# Patient Record
Sex: Female | Born: 1955 | Race: White | Hispanic: No | Marital: Married | Smoking: Current every day smoker
Health system: Southern US, Community
[De-identification: ages and names within clinical notes are randomized; demographics above are authoritative.]

## PROBLEM LIST (undated history)

## (undated) DIAGNOSIS — M1990 Unspecified osteoarthritis, unspecified site: Secondary | ICD-10-CM

## (undated) HISTORY — PX: PELVIC FRACTURE SURGERY: SHX119

## (undated) HISTORY — PX: HIP SURGERY: SHX245

## (undated) HISTORY — PX: FOOT SURGERY: SHX648

---

## 1998-05-31 ENCOUNTER — Other Ambulatory Visit: Admission: RE | Admit: 1998-05-31 | Discharge: 1998-05-31 | Payer: Self-pay | Admitting: Gynecology

## 1999-06-28 ENCOUNTER — Other Ambulatory Visit: Admission: RE | Admit: 1999-06-28 | Discharge: 1999-06-28 | Payer: Self-pay | Admitting: Gynecology

## 1999-09-16 ENCOUNTER — Encounter: Admission: RE | Admit: 1999-09-16 | Discharge: 1999-09-16 | Payer: Self-pay | Admitting: Gynecology

## 1999-09-16 ENCOUNTER — Encounter: Payer: Self-pay | Admitting: Gynecology

## 2000-07-25 ENCOUNTER — Other Ambulatory Visit: Admission: RE | Admit: 2000-07-25 | Discharge: 2000-07-25 | Payer: Self-pay | Admitting: Gynecology

## 2000-09-25 ENCOUNTER — Encounter: Admission: RE | Admit: 2000-09-25 | Discharge: 2000-09-25 | Payer: Self-pay | Admitting: Gynecology

## 2000-09-25 ENCOUNTER — Encounter: Payer: Self-pay | Admitting: Gynecology

## 2001-08-05 ENCOUNTER — Other Ambulatory Visit: Admission: RE | Admit: 2001-08-05 | Discharge: 2001-08-05 | Payer: Self-pay | Admitting: Gynecology

## 2001-10-10 ENCOUNTER — Encounter: Payer: Self-pay | Admitting: Gynecology

## 2001-10-10 ENCOUNTER — Encounter: Admission: RE | Admit: 2001-10-10 | Discharge: 2001-10-10 | Payer: Self-pay | Admitting: Gynecology

## 2002-10-15 ENCOUNTER — Encounter: Admission: RE | Admit: 2002-10-15 | Discharge: 2002-10-15 | Payer: Self-pay | Admitting: Gynecology

## 2002-10-15 ENCOUNTER — Encounter: Payer: Self-pay | Admitting: Gynecology

## 2002-10-27 ENCOUNTER — Other Ambulatory Visit: Admission: RE | Admit: 2002-10-27 | Discharge: 2002-10-27 | Payer: Self-pay | Admitting: Gynecology

## 2003-12-07 ENCOUNTER — Other Ambulatory Visit: Admission: RE | Admit: 2003-12-07 | Discharge: 2003-12-07 | Payer: Self-pay | Admitting: Gynecology

## 2005-01-27 ENCOUNTER — Other Ambulatory Visit: Admission: RE | Admit: 2005-01-27 | Discharge: 2005-01-27 | Payer: Self-pay | Admitting: Gynecology

## 2005-05-05 ENCOUNTER — Encounter: Admission: RE | Admit: 2005-05-05 | Discharge: 2005-05-05 | Payer: Self-pay | Admitting: Family Medicine

## 2005-07-05 ENCOUNTER — Encounter: Admission: RE | Admit: 2005-07-05 | Discharge: 2005-07-05 | Payer: Self-pay | Admitting: Orthopedic Surgery

## 2005-09-13 ENCOUNTER — Encounter: Admission: RE | Admit: 2005-09-13 | Discharge: 2005-09-13 | Payer: Self-pay | Admitting: Orthopedic Surgery

## 2006-02-01 ENCOUNTER — Other Ambulatory Visit: Admission: RE | Admit: 2006-02-01 | Discharge: 2006-02-01 | Payer: Self-pay | Admitting: Gynecology

## 2007-01-29 ENCOUNTER — Other Ambulatory Visit: Admission: RE | Admit: 2007-01-29 | Discharge: 2007-01-29 | Payer: Self-pay | Admitting: Gynecology

## 2008-04-21 ENCOUNTER — Encounter: Admission: RE | Admit: 2008-04-21 | Discharge: 2008-04-21 | Payer: Self-pay | Admitting: Family Medicine

## 2010-12-10 ENCOUNTER — Encounter: Payer: Self-pay | Admitting: Orthopedic Surgery

## 2011-01-20 ENCOUNTER — Ambulatory Visit
Admission: RE | Admit: 2011-01-20 | Discharge: 2011-01-20 | Disposition: A | Discharge status: Home / Self Care | Payer: BC Managed Care – PPO | Source: Ambulatory Visit | Attending: Family Medicine | Admitting: Family Medicine

## 2011-01-20 ENCOUNTER — Other Ambulatory Visit: Payer: Self-pay | Admitting: Family Medicine

## 2011-01-20 DIAGNOSIS — R059 Cough, unspecified: Secondary | ICD-10-CM

## 2011-01-20 DIAGNOSIS — R05 Cough: Secondary | ICD-10-CM

## 2012-08-29 ENCOUNTER — Emergency Department (INDEPENDENT_AMBULATORY_CARE_PROVIDER_SITE_OTHER)
Admission: EM | Admit: 2012-08-29 | Discharge: 2012-08-29 | Disposition: A | Discharge status: Home / Self Care | Payer: BC Managed Care – PPO | Source: Home / Self Care | Attending: Family Medicine | Admitting: Family Medicine

## 2012-08-29 ENCOUNTER — Encounter (HOSPITAL_COMMUNITY): Payer: Self-pay | Admitting: *Deleted

## 2012-08-29 ENCOUNTER — Emergency Department (HOSPITAL_COMMUNITY): Payer: BC Managed Care – PPO | Attending: Emergency Medicine

## 2012-08-29 DIAGNOSIS — M79672 Pain in left foot: Secondary | ICD-10-CM

## 2012-08-29 DIAGNOSIS — M79609 Pain in unspecified limb: Secondary | ICD-10-CM

## 2012-08-29 DIAGNOSIS — S92309A Fracture of unspecified metatarsal bone(s), unspecified foot, initial encounter for closed fracture: Secondary | ICD-10-CM

## 2012-08-29 DIAGNOSIS — X58XXXA Exposure to other specified factors, initial encounter: Secondary | ICD-10-CM | POA: Insufficient documentation

## 2012-08-29 MED ORDER — IBUPROFEN 600 MG PO TABS: 600.0000 mg | ORAL_TABLET | Freq: Four times a day (QID) | ORAL | Status: DC | PRN

## 2012-08-29 MED ORDER — HYDROCODONE-ACETAMINOPHEN 5-500 MG PO TABS: 1.0000 | ORAL_TABLET | Freq: Four times a day (QID) | ORAL | Status: DC | PRN

## 2012-08-29 NOTE — ED Notes (Signed)
Reported  Off  To    kim

## 2012-08-29 NOTE — ED Provider Notes (Signed)
History     CSN: [id number]  Arrival date & time 08/29/12  1612   First MD Initiated Contact with Patient 08/29/12 1730      Chief Complaint  Patient presents with  . Foot Injury    (Consider location/radiation/quality/duration/timing/severity/associated sxs/prior treatment) HPI  History reviewed. No pertinent past medical history.  Past Surgical History  Procedure Date  . Hip surgery   . Foot surgery     No family history on file.  History  Substance Use Topics  . Smoking status: Current Every Day Smoker  . Smokeless tobacco: Not on file  . Alcohol Use: Yes    OB History    Grav Para Term Preterm Abortions TAB SAB Ect Mult Living                  Review of Systems  Allergies  Review of patient's allergies indicates not on file.  Home Medications   Current Outpatient Rx  Name Route Sig Dispense Refill  . CITRACAL PO Oral Take by mouth.    Truitt Merle DS PO Oral Take by mouth.    . MULTIVITAMINS PO CAPS Oral Take 1 capsule by mouth daily.    Marland Kitchen ZOLOFT PO Oral Take by mouth.    Marland Kitchen HYDROCODONE-ACETAMINOPHEN 5-500 MG PO TABS Oral Take 1 tablet by mouth every 6 (six) hours as needed for pain. 20 tablet 0  . IBUPROFEN 600 MG PO TABS Oral Take 1 tablet (600 mg total) by mouth every 6 (six) hours as needed for pain. 30 tablet 0    BP 106/79  Pulse 74  Temp 98.4 F (36.9 C) (Oral)  Resp 16  SpO2 99%  Physical Exam  ED Course  Procedures (including critical care time)  Labs Reviewed - No data to display Dg Foot Complete Left  08/29/2012  *RADIOLOGY REPORT*  Clinical Data: Foot pain status post injury today.  LEFT FOOT - COMPLETE 3+ VIEW  Comparison: Left ankle radiographs 05/02/2010 and MRI 05/12/2010.  Findings: There are mildly displaced acute fractures involving the bases of the second, third and fourth metatarsals.  There is a possible nondisplaced fracture involving the first metatarsal base. Some of these fractures may extend into the Lisfranc joint.   There is no significant subluxation at the Lisfranc joint.  The bones of the midfoot appear intact.  Calcaneal stress fracture has healed.  IMPRESSION: Mildly displaced fractures of the second through fourth metatarsal bases with possible proximal intra-articular extension.  Possible nondisplaced first metatarsal base fracture.  No dislocation at the Lisfranc joint identified.   Original Report Authenticated By: Gerrianne Scale, M.D.      1. Closed fracture of base of metatarsal bone   2. Left foot pain       MDM          Linna Hoff, MD 08/29/12 2023

## 2012-08-29 NOTE — ED Notes (Signed)
Pt  Rep[orts   approx 2  Hours  Ago  She  Felled  On her    l  Foot  She  Has  Pain   Over  The  Top  Of  The  Foot  She  Has  Pain on  Weight   Bearing

## 2012-08-29 NOTE — ED Notes (Signed)
Returned from radiology -went to the main hospital radiology

## 2012-08-29 NOTE — Progress Notes (Signed)
Orthopedic Tech Progress Note Patient Details:  Julie Schneider [birth year] [id number]  Ortho Devices Type of Ortho Device: Post (short) splint Splint Material: Fiberglass Ortho Device/Splint Location: (L) LE Ortho Device/Splint Interventions: Ordered;Application   Jennye Moccasin 08/29/2012, 7:19 PM

## 2012-08-29 NOTE — ED Notes (Signed)
Report to radiology given

## 2012-08-29 NOTE — ED Notes (Addendum)
Ortho tech applied posterior splint.  Pt. wheeled to car in W/C and assisted into car. Pt. insists on driving home.

## 2012-08-29 NOTE — ED Provider Notes (Signed)
History     CSN: [id number]  Arrival date & time 08/29/12  1612   None     Chief Complaint  Patient presents with  . Foot Injury    (Consider location/radiation/quality/duration/timing/severity/associated sxs/prior treatment) The history is provided by the patient.  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  States pain is sharp and constant in nature felt mostly over the top of her foot associated with swelling.  Pain is worse with ambulation and movement.  Has not taken medication for pain.  Reports past history of left foot fracture 1-2 years ago.  History reviewed. No pertinent past medical history.  Past Surgical History  Procedure Date  . Hip surgery   . Foot surgery     No family history on file.  History  Substance Use Topics  . Smoking status: Current Every Day Smoker  . Smokeless tobacco: Not on file  . Alcohol Use: Yes    OB History    Grav Para Term Preterm Abortions TAB SAB Ect Mult Living                  Review of Systems  Constitutional: Negative.   Respiratory: Negative.   Cardiovascular: Negative.   Musculoskeletal: Positive for joint swelling, arthralgias and gait problem.  Skin: Negative.   Neurological: Negative.     Allergies  Review of patient's allergies indicates not on file.  Home Medications   Current Outpatient Rx  Name Route Sig Dispense Refill  . CITRACAL PO Oral Take by mouth.    Truitt Merle DS PO Oral Take by mouth.    . MULTIVITAMINS PO CAPS Oral Take 1 capsule by mouth daily.    Marland Kitchen ZOLOFT PO Oral Take by mouth.    Marland Kitchen HYDROCODONE-ACETAMINOPHEN 5-500 MG PO TABS Oral Take 1 tablet by mouth every 6 (six) hours as needed for pain. 20 tablet 0  . IBUPROFEN 600 MG PO TABS Oral Take 1 tablet (600 mg total) by mouth every 6 (six) hours as needed for pain. 30 tablet 0    BP 106/79  Pulse 74  Temp 98.4 F (36.9 C) (Oral)  Resp 16  SpO2 99%  Physical  Exam  Nursing note and vitals reviewed. Constitutional: She is oriented to person, place, and time. Vital signs are normal. She appears well-developed and well-nourished. She is active and cooperative.  HENT:  Head: Normocephalic.  Eyes: Conjunctivae normal are normal. Pupils are equal, round, and reactive to light. No scleral icterus.  Neck: Trachea normal. Neck supple.  Cardiovascular: Normal rate, regular rhythm and normal heart sounds.   Pulmonary/Chest: Effort normal and breath sounds normal. No respiratory distress.  Musculoskeletal:       Left knee: Normal.       Right ankle: Normal.       Left ankle: Normal. Achilles tendon normal.       Right foot: Normal.       Left foot: She exhibits tenderness, bony tenderness and swelling. She exhibits normal range of motion, normal capillary refill, no crepitus, no deformity and no laceration.       Feet:  Neurological: She is alert and oriented to person, place, and time. She has normal strength. No cranial nerve deficit or sensory deficit. Gait abnormal. Coordination normal. GCS eye subscore is 4. GCS verbal subscore is 5. GCS motor subscore is 6.       Sensation intact distally, left foot  weakness secondary to pain.  Skin: Skin is warm, dry and intact.  Psychiatric: She has a normal mood and affect. Her speech is normal and behavior is normal. Judgment and thought content normal. Cognition and memory are normal.    ED Course  Procedures (including critical care time)  Labs Reviewed - No data to display No results found. *RADIOLOGY REPORT*  Clinical Data: Foot pain status post injury today.  LEFT FOOT - COMPLETE 3+ VIEW  Comparison: Left ankle radiographs 05/02/2010 and MRI 05/12/2010.  Findings: There are mildly displaced acute fractures involving the  bases of the second, third and fourth metatarsals. There is a  possible nondisplaced fracture involving the first metatarsal base.  Some of these fractures may extend into the  Lisfranc joint. There  is no significant subluxation at the Lisfranc joint. The bones of  the midfoot appear intact. Calcaneal stress fracture has healed.  IMPRESSION:  Mildly displaced fractures of the second through fourth metatarsal  bases with possible proximal intra-articular extension. Possible  nondisplaced first metatarsal base fracture. No dislocation at the  Lisfranc joint identified.  Original Report Authenticated By: Gerrianne Scale, M.D.   1. Closed fracture of base of metatarsal bone   2. Left foot pain       MDM  Patient care delay related to xray malfunction-sent to [facility] for imaging.  Patient refused analgesia related to no driver present, requested ice only. Phone Consult Dr. Venita Lick, orthopedist.  Posterior splint, follow up in office tomorrow.        Johnsie Kindred, NP 09/05/12 [phone]

## 2012-08-29 NOTE — Progress Notes (Signed)
Orthopedic Tech Progress Note Patient Details:  Julie Schneider 1956-03-08 [id number]  Ortho Devices Type of Ortho Device: Post (short) splint;Ace wrap Splint Material: Fiberglass Ortho Device/Splint Location: (L) LE Ortho Device/Splint Interventions: Ordered;Application   Jennye Moccasin 08/29/2012, 7:19 PM

## 2012-08-29 NOTE — ED Notes (Signed)
Ice pack applied after xrays.

## 2012-08-29 NOTE — ED Notes (Signed)
Ortho paged. 

## 2012-09-06 NOTE — ED Provider Notes (Signed)
Medical screening examination/treatment/procedure(s) were performed by resident physician or non-physician practitioner and as supervising physician I was immediately available for consultation/collaboration.   Lucielle Vokes DOUGLAS MD.    Denina Rieger D Zurisadai Helminiak, MD 09/06/12 1406 

## 2012-10-09 ENCOUNTER — Other Ambulatory Visit: Payer: Self-pay | Admitting: Family Medicine

## 2012-10-09 DIAGNOSIS — E049 Nontoxic goiter, unspecified: Secondary | ICD-10-CM

## 2012-10-11 ENCOUNTER — Other Ambulatory Visit: Payer: Self-pay

## 2012-10-11 ENCOUNTER — Ambulatory Visit
Admission: RE | Admit: 2012-10-11 | Discharge: 2012-10-11 | Disposition: A | Discharge status: Home / Self Care | Payer: BC Managed Care – PPO | Source: Ambulatory Visit | Attending: Family Medicine | Admitting: Family Medicine

## 2012-10-11 DIAGNOSIS — E049 Nontoxic goiter, unspecified: Secondary | ICD-10-CM

## 2013-09-23 DIAGNOSIS — S32409A Unspecified fracture of unspecified acetabulum, initial encounter for closed fracture: Secondary | ICD-10-CM | POA: Insufficient documentation

## 2013-10-28 DIAGNOSIS — M25551 Pain in right hip: Secondary | ICD-10-CM | POA: Insufficient documentation

## 2013-11-05 ENCOUNTER — Other Ambulatory Visit: Payer: Self-pay | Admitting: Family Medicine

## 2013-11-05 DIAGNOSIS — E041 Nontoxic single thyroid nodule: Secondary | ICD-10-CM

## 2013-11-06 ENCOUNTER — Ambulatory Visit
Admission: RE | Admit: 2013-11-06 | Discharge: 2013-11-06 | Disposition: A | Discharge status: Home / Self Care | Payer: 59 | Source: Ambulatory Visit | Attending: Family Medicine | Admitting: Family Medicine

## 2013-11-06 DIAGNOSIS — E041 Nontoxic single thyroid nodule: Secondary | ICD-10-CM

## 2014-01-13 ENCOUNTER — Other Ambulatory Visit (HOSPITAL_COMMUNITY): Payer: Self-pay | Admitting: Orthopaedic Surgery

## 2014-01-13 DIAGNOSIS — S32409A Unspecified fracture of unspecified acetabulum, initial encounter for closed fracture: Secondary | ICD-10-CM

## 2014-01-22 ENCOUNTER — Ambulatory Visit (HOSPITAL_COMMUNITY)
Admission: RE | Admit: 2014-01-22 | Discharge: 2014-01-22 | Disposition: A | Discharge status: Home / Self Care | Payer: 59 | Source: Ambulatory Visit | Attending: Orthopaedic Surgery | Admitting: Orthopaedic Surgery

## 2014-01-22 DIAGNOSIS — X58XXXA Exposure to other specified factors, initial encounter: Secondary | ICD-10-CM | POA: Insufficient documentation

## 2014-01-22 DIAGNOSIS — S32509A Unspecified fracture of unspecified pubis, initial encounter for closed fracture: Secondary | ICD-10-CM | POA: Insufficient documentation

## 2014-01-22 DIAGNOSIS — Z96649 Presence of unspecified artificial hip joint: Secondary | ICD-10-CM | POA: Insufficient documentation

## 2014-01-22 DIAGNOSIS — S32409A Unspecified fracture of unspecified acetabulum, initial encounter for closed fracture: Secondary | ICD-10-CM | POA: Insufficient documentation

## 2014-02-12 DIAGNOSIS — M810 Age-related osteoporosis without current pathological fracture: Secondary | ICD-10-CM | POA: Insufficient documentation

## 2014-02-12 DIAGNOSIS — F39 Unspecified mood [affective] disorder: Secondary | ICD-10-CM | POA: Insufficient documentation

## 2014-02-18 DIAGNOSIS — T84019A Broken internal joint prosthesis, unspecified site, initial encounter: Secondary | ICD-10-CM | POA: Insufficient documentation

## 2014-02-19 DIAGNOSIS — D500 Iron deficiency anemia secondary to blood loss (chronic): Secondary | ICD-10-CM | POA: Insufficient documentation

## 2014-04-01 DIAGNOSIS — Z96649 Presence of unspecified artificial hip joint: Secondary | ICD-10-CM | POA: Insufficient documentation

## 2014-08-15 IMAGING — CR DG FOOT COMPLETE 3+V*L*
3 series · 3 of 3 positions shown · non-contrast
Comparison: Left ankle radiographs 05/02/2010 and MRI 05/12/2010.

CLINICAL DATA: Foot pain status post injury today.

LEFT FOOT - COMPLETE 3+ VIEW

[t foot ap left]
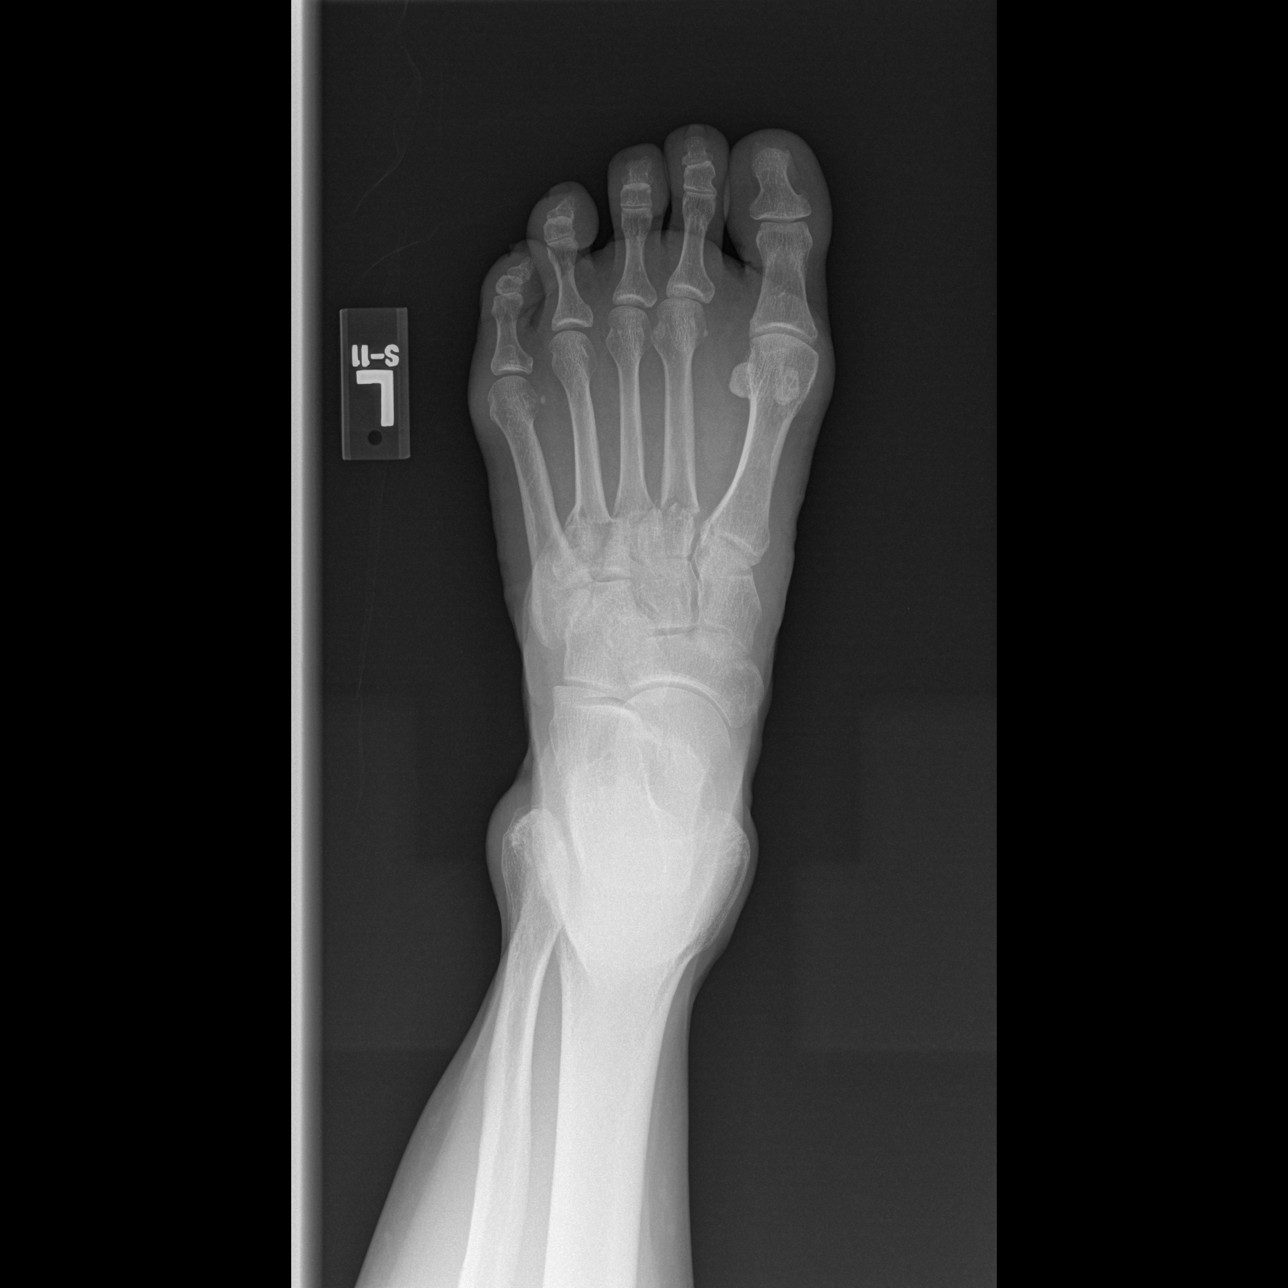

[t foot oblique left]
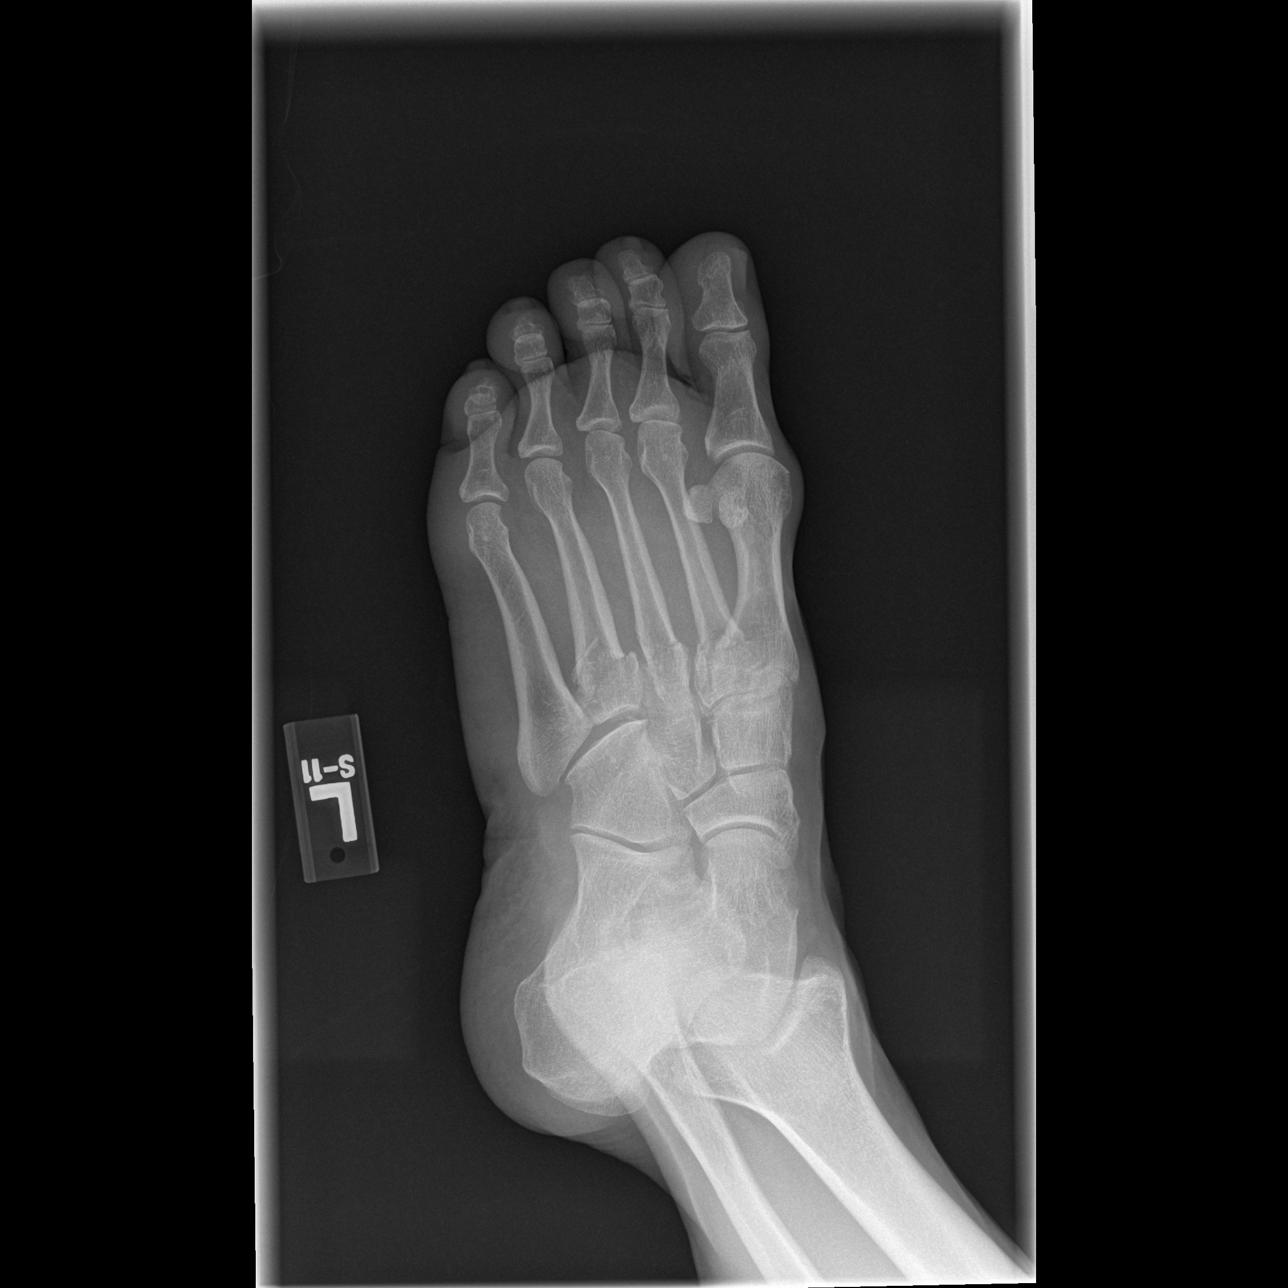

[t foot lat left]
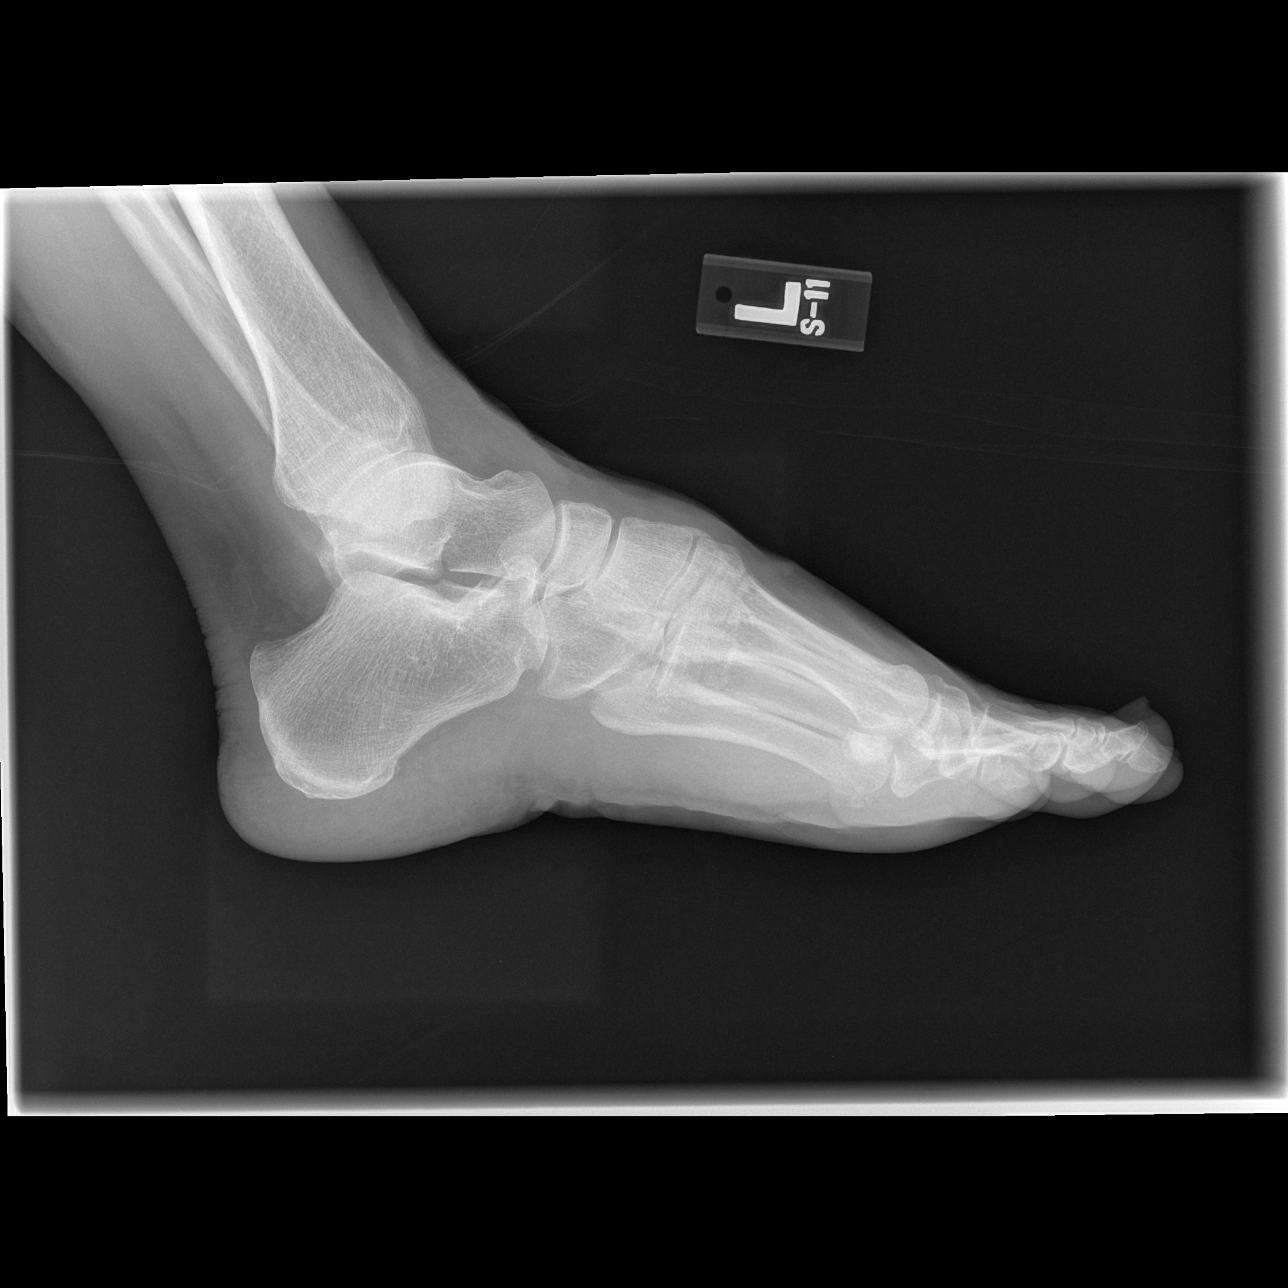

[3 of 3 positions shown; findings below may reference images not displayed]

FINDINGS: There are mildly displaced acute fractures involving the
bases of the second, third and fourth metatarsals.  There is a
possible nondisplaced fracture involving the first metatarsal base.
Some of these fractures may extend into the Lisfranc joint.  There
is no significant subluxation at the Lisfranc joint.  The bones of
the midfoot appear intact.  Calcaneal stress fracture has healed.
IMPRESSION: Mildly displaced fractures of the second through fourth metatarsal
bases with possible proximal intra-articular extension.  Possible
nondisplaced first metatarsal base fracture.  No dislocation at the
Lisfranc joint identified.

## 2014-11-23 ENCOUNTER — Other Ambulatory Visit: Payer: Self-pay | Admitting: Family Medicine

## 2014-11-23 ENCOUNTER — Other Ambulatory Visit (HOSPITAL_COMMUNITY)
Admission: RE | Admit: 2014-11-23 | Discharge: 2014-11-23 | Disposition: A | Discharge status: Home / Self Care | Payer: 59 | Source: Ambulatory Visit | Attending: Family Medicine | Admitting: Family Medicine

## 2014-11-23 DIAGNOSIS — Z1151 Encounter for screening for human papillomavirus (HPV): Secondary | ICD-10-CM | POA: Diagnosis present

## 2014-11-23 DIAGNOSIS — Z124 Encounter for screening for malignant neoplasm of cervix: Secondary | ICD-10-CM | POA: Diagnosis not present

## 2014-11-23 DIAGNOSIS — E041 Nontoxic single thyroid nodule: Secondary | ICD-10-CM

## 2014-11-24 LAB — CYTOLOGY - PAP

## 2014-11-26 ENCOUNTER — Ambulatory Visit
Admission: RE | Admit: 2014-11-26 | Discharge: 2014-11-26 | Disposition: A | Discharge status: Home / Self Care | Payer: 59 | Source: Ambulatory Visit | Attending: Family Medicine | Admitting: Family Medicine

## 2014-11-26 DIAGNOSIS — E041 Nontoxic single thyroid nodule: Secondary | ICD-10-CM

## 2017-02-09 DIAGNOSIS — M8580 Other specified disorders of bone density and structure, unspecified site: Secondary | ICD-10-CM | POA: Diagnosis not present

## 2017-02-09 DIAGNOSIS — Z720 Tobacco use: Secondary | ICD-10-CM | POA: Diagnosis not present

## 2017-02-09 DIAGNOSIS — Z0000 Encounter for general adult medical examination without abnormal findings: Secondary | ICD-10-CM | POA: Diagnosis not present

## 2017-03-14 DIAGNOSIS — Z23 Encounter for immunization: Secondary | ICD-10-CM | POA: Diagnosis not present

## 2017-06-11 DIAGNOSIS — M8589 Other specified disorders of bone density and structure, multiple sites: Secondary | ICD-10-CM | POA: Diagnosis not present

## 2017-06-11 DIAGNOSIS — Z803 Family history of malignant neoplasm of breast: Secondary | ICD-10-CM | POA: Diagnosis not present

## 2017-06-11 DIAGNOSIS — Z1231 Encounter for screening mammogram for malignant neoplasm of breast: Secondary | ICD-10-CM | POA: Diagnosis not present

## 2017-10-18 DIAGNOSIS — Z23 Encounter for immunization: Secondary | ICD-10-CM | POA: Diagnosis not present

## 2018-02-12 ENCOUNTER — Other Ambulatory Visit: Payer: Self-pay | Admitting: Family Medicine

## 2018-02-12 DIAGNOSIS — Z23 Encounter for immunization: Secondary | ICD-10-CM | POA: Diagnosis not present

## 2018-02-12 DIAGNOSIS — Z0000 Encounter for general adult medical examination without abnormal findings: Secondary | ICD-10-CM | POA: Diagnosis not present

## 2018-02-12 DIAGNOSIS — Z13220 Encounter for screening for lipoid disorders: Secondary | ICD-10-CM | POA: Diagnosis not present

## 2018-02-12 DIAGNOSIS — E041 Nontoxic single thyroid nodule: Secondary | ICD-10-CM

## 2018-02-15 ENCOUNTER — Ambulatory Visit
Admission: RE | Admit: 2018-02-15 | Discharge: 2018-02-15 | Disposition: A | Discharge status: Home / Self Care | Payer: 59 | Source: Ambulatory Visit | Attending: Family Medicine | Admitting: Family Medicine

## 2018-02-15 DIAGNOSIS — E042 Nontoxic multinodular goiter: Secondary | ICD-10-CM | POA: Diagnosis not present

## 2018-02-15 DIAGNOSIS — E041 Nontoxic single thyroid nodule: Secondary | ICD-10-CM

## 2018-06-13 DIAGNOSIS — Z1231 Encounter for screening mammogram for malignant neoplasm of breast: Secondary | ICD-10-CM | POA: Diagnosis not present

## 2018-06-13 DIAGNOSIS — Z803 Family history of malignant neoplasm of breast: Secondary | ICD-10-CM | POA: Diagnosis not present

## 2018-07-07 DIAGNOSIS — S92325K Nondisplaced fracture of second metatarsal bone, left foot, subsequent encounter for fracture with nonunion: Secondary | ICD-10-CM | POA: Diagnosis not present

## 2018-07-07 DIAGNOSIS — M79672 Pain in left foot: Secondary | ICD-10-CM | POA: Diagnosis not present

## 2018-07-07 DIAGNOSIS — S92902A Unspecified fracture of left foot, initial encounter for closed fracture: Secondary | ICD-10-CM | POA: Diagnosis not present

## 2018-07-07 DIAGNOSIS — S92325A Nondisplaced fracture of second metatarsal bone, left foot, initial encounter for closed fracture: Secondary | ICD-10-CM | POA: Diagnosis not present

## 2018-07-07 DIAGNOSIS — S9032XA Contusion of left foot, initial encounter: Secondary | ICD-10-CM | POA: Diagnosis not present

## 2018-07-07 DIAGNOSIS — S99922A Unspecified injury of left foot, initial encounter: Secondary | ICD-10-CM | POA: Diagnosis not present

## 2018-07-07 DIAGNOSIS — S99912A Unspecified injury of left ankle, initial encounter: Secondary | ICD-10-CM | POA: Diagnosis not present

## 2018-07-09 DIAGNOSIS — M84375A Stress fracture, left foot, initial encounter for fracture: Secondary | ICD-10-CM | POA: Diagnosis not present

## 2018-07-18 DIAGNOSIS — M84375A Stress fracture, left foot, initial encounter for fracture: Secondary | ICD-10-CM | POA: Diagnosis not present

## 2018-07-25 DIAGNOSIS — M84375A Stress fracture, left foot, initial encounter for fracture: Secondary | ICD-10-CM | POA: Diagnosis not present

## 2018-07-25 DIAGNOSIS — S92002A Unspecified fracture of left calcaneus, initial encounter for closed fracture: Secondary | ICD-10-CM | POA: Diagnosis not present

## 2018-08-19 DIAGNOSIS — M84375A Stress fracture, left foot, initial encounter for fracture: Secondary | ICD-10-CM | POA: Diagnosis not present

## 2020-07-05 ENCOUNTER — Other Ambulatory Visit (HOSPITAL_COMMUNITY)
Admission: RE | Admit: 2020-07-05 | Discharge: 2020-07-05 | Disposition: A | Discharge status: Home / Self Care | Payer: 59 | Source: Ambulatory Visit | Attending: Family Medicine | Admitting: Family Medicine

## 2020-07-05 ENCOUNTER — Other Ambulatory Visit: Payer: Self-pay | Admitting: Family Medicine

## 2020-07-05 DIAGNOSIS — Z124 Encounter for screening for malignant neoplasm of cervix: Secondary | ICD-10-CM | POA: Insufficient documentation

## 2020-07-06 ENCOUNTER — Other Ambulatory Visit: Payer: Self-pay | Admitting: Family Medicine

## 2020-07-06 DIAGNOSIS — Z720 Tobacco use: Secondary | ICD-10-CM

## 2020-07-06 DIAGNOSIS — E041 Nontoxic single thyroid nodule: Secondary | ICD-10-CM

## 2020-07-07 LAB — CYTOLOGY - PAP
Comment: NEGATIVE
Diagnosis: NEGATIVE
High risk HPV: NEGATIVE

## 2020-07-20 ENCOUNTER — Ambulatory Visit
Admission: RE | Admit: 2020-07-20 | Discharge: 2020-07-20 | Disposition: A | Discharge status: Home / Self Care | Payer: 59 | Source: Ambulatory Visit | Attending: Family Medicine | Admitting: Family Medicine

## 2020-07-20 ENCOUNTER — Other Ambulatory Visit: Payer: Self-pay

## 2020-07-20 DIAGNOSIS — Z720 Tobacco use: Secondary | ICD-10-CM

## 2020-07-20 DIAGNOSIS — E041 Nontoxic single thyroid nodule: Secondary | ICD-10-CM

## 2021-09-22 ENCOUNTER — Other Ambulatory Visit: Payer: Self-pay

## 2021-09-22 ENCOUNTER — Encounter: Payer: Self-pay | Admitting: Podiatry

## 2021-09-22 ENCOUNTER — Ambulatory Visit: Payer: 59 | Admitting: Podiatry

## 2021-09-22 DIAGNOSIS — M79676 Pain in unspecified toe(s): Secondary | ICD-10-CM

## 2021-09-22 DIAGNOSIS — L600 Ingrowing nail: Secondary | ICD-10-CM

## 2021-09-22 DIAGNOSIS — L608 Other nail disorders: Secondary | ICD-10-CM | POA: Diagnosis not present

## 2021-09-22 DIAGNOSIS — B351 Tinea unguium: Secondary | ICD-10-CM | POA: Diagnosis not present

## 2021-09-22 NOTE — Progress Notes (Signed)
  Subjective:  Patient ID: Julie Schneider, female    DOB: Jun 12, 1956,  MRN: [id number]  Chief Complaint  Patient presents with   Nail Problem    The toenails are curling in and thick and discolored and hurts with pressure and this has been going for years   65 y.o. female presents with the above complaint. History confirmed with patient.  Objective:  Physical Exam: warm, good capillary refill, no trophic changes or ulcerative lesions, normal DP and PT pulses, and normal sensory exam. Pincer nails with POP bilaterally, worst at hallux/2nd toenails bilat Assessment:   1. Ingrown nail   2. Pain around toenail   3. Pincer nail deformity   4. Onychomycosis      Plan:  Patient was evaluated and treated and all questions answered.  Onychodystrophy -Nails palliatively debrided secondary to pain  Procedure: Nail Debridement Type of Debridement: manual, sharp debridement. Instrumentation: Nail nipper, rotary burr. Number of Nails: 10  Return if symptoms worsen or fail to improve.

## 2021-11-03 ENCOUNTER — Other Ambulatory Visit: Payer: Self-pay | Admitting: *Deleted

## 2021-11-03 DIAGNOSIS — F17210 Nicotine dependence, cigarettes, uncomplicated: Secondary | ICD-10-CM

## 2021-11-03 DIAGNOSIS — Z87891 Personal history of nicotine dependence: Secondary | ICD-10-CM

## 2021-11-28 NOTE — Addendum Note (Signed)
Addended by: Martyn Ehrich on: 11/28/2021 09:50 AM   Modules accepted: Orders

## 2021-11-28 NOTE — Progress Notes (Addendum)
Shared Decision Making Visit Lung Cancer Screening Program [phone])   Eligibility: Age 66 y.o. Pack Years Smoking History Calculation  (# packs/per year x # years smoked) 36.75 (49 years x 3/4 ppd) Recent History of coughing up blood  no Unexplained weight loss? no ( >Than 15 pounds within the last 6 months ) Prior History Lung / other cancer no (Diagnosis within the last 5 years already requiring surveillance chest CT Scans). Smoking Status Current Smoker Former Smokers: Years since quit: < 1 year  Quit Date: Na  Visit Components: Discussion included one or more decision making aids. yes Discussion included risk/benefits of screening. yes Discussion included potential follow up diagnostic testing for abnormal scans. yes Discussion included meaning and risk of over diagnosis. yes Discussion included meaning and risk of False Positives. yes Discussion included meaning of total radiation exposure. yes  Counseling Included: Importance of adherence to annual lung cancer LDCT screening. yes Impact of comorbidities on ability to participate in the program. yes Ability and willingness to under diagnostic treatment. yes  Smoking Cessation Counseling: Current Smokers:  Discussed importance of smoking cessation. yes Information about tobacco cessation classes and interventions provided to patient. yes Patient provided with "ticket" for LDCT Scan. na Symptomatic Patient. no  Counseling(Intermediate counseling: > three minutes) 99406 Diagnosis Code: Tobacco Use Z72.0 Asymptomatic Patient yes  Counseling (Intermediate counseling: > three minutes counseling) [id number] Former Smokers:  Discussed the importance of maintaining cigarette abstinence. yes Diagnosis Code: Personal History of Nicotine Dependence. B16.606 Information about tobacco cessation classes and interventions provided to patient. Yes Patient provided with "ticket" for LDCT Scan. Na Written Order for Lung Cancer Screening with  LDCT placed in Epic. Yes (CT Chest Lung Cancer Screening Low Dose W/O CM) [id number] Z12.2-Screening of respiratory organs Z87.891-Personal history of nicotine dependence I have spent 25 minutes of face to face/ virtual visit   time with Ms Kathlynn Grate discussing the risks and benefits of lung cancer screening. We viewed / discussed a power point together that explained in detail the above noted topics. We paused at intervals to allow for questions to be asked and answered to ensure understanding.We discussed that the single most powerful action that she can take to decrease her risk of developing lung cancer is to quit smoking. We discussed whether or not he is ready to commit to setting a quit date. We discussed options for tools to aid in quitting smoking including nicotine replacement therapy, non-nicotine medications, support groups, Quit Smart classes, and behavior modification. We discussed that often times setting smaller, more achievable goals, such as eliminating 1 cigarette a day for a week and then 2 cigarettes a day for a week can be helpful in slowly decreasing the number of cigarettes smoked. This allows for a sense of accomplishment as well as providing a clinical benefit. I provided her  with smoking cessation  information  with contact information for community resources, classes, free nicotine replacement therapy, and access to mobile apps, text messaging, and on-line smoking cessation help. I have also provided her  the office contact information in the event she needs to contact me, or the screening staff. We discussed the time and location of the scan, and that either Abigail Miyamoto RN, Karlton Lemon, RN  or I will call / send a letter with the results within 24-72 hours of receiving them. The patient verbalized understanding of all of  the above and had no further questions upon leaving the office. They have my contact information in the event they  have any further questions.  I spent 3-5  minutes counseling on smoking cessation and the health risks of continued tobacco abuse.  I explained to the patient that there has been a high incidence of coronary artery disease noted on these exams. I explained that this is a non-gated exam therefore degree or severity cannot be determined. This patient is NOT on statin therapy. I have asked the patient to follow-up with their PCP regarding any incidental finding of coronary artery disease and management with diet or medication as their PCP  feels is clinically indicated. The patient verbalized understanding of the above and had no further questions upon completion of the visit.     Glenford Bayley, NP

## 2021-11-28 NOTE — Patient Instructions (Signed)
Thank you for participating in the  Lung Cancer Screening Program. °It was our pleasure to meet you today. °We will call you with the results of your scan within the next few days. °Your scan will be assigned a Lung RADS category score by the physicians reading the scans.  °This Lung RADS score determines follow up scanning.  °See below for description of categories, and follow up screening recommendations. °We will be in touch to schedule your follow up screening annually or based on recommendations of our providers. °We will fax a copy of your scan results to your Primary Care Physician, or the physician who referred you to the program, to ensure they have the results. °Please call the office if you have any questions or concerns regarding your scanning experience or results.  °Our office number is [phone]. °Please speak with Denise Phelps, RN. She is our Lung Cancer Screening RN. °If she is unavailable when you call, please have the office staff send her a message. She will return your call at her earliest convenience. °Remember, if your scan is normal, we will scan you annually as long as you continue to meet the criteria for the program. (Age 55-77, Current smoker or smoker who has quit within the last 15 years). °If you are a smoker, remember, quitting is the single most powerful action that you can take to decrease your risk of lung cancer and other pulmonary, breathing related problems. °We know quitting is hard, and we are here to help.  °Please let us know if there is anything we can do to help you meet your goal of quitting. °If you are a former smoker, congratulations. We are proud of you! Remain smoke free! °Remember you can refer friends or family members through the number above.  °We will screen them to make sure they meet criteria for the program. °Thank you for helping us take better care of you by participating in Lung Screening. ° °You can receive free nicotine replacement therapy  ( patches, gum or mints) by calling 1-800-QUIT NOW. Please call so we can get you on the path to becoming  a non-smoker. I know it is hard, but you can do this! ° °Lung RADS Categories: ° °Lung RADS 1: no nodules or definitely non-concerning nodules.  °Recommendation is for a repeat annual scan in 12 months. ° °Lung RADS 2:  nodules that are non-concerning in appearance and behavior with a very low likelihood of becoming an active cancer. °Recommendation is for a repeat annual scan in 12 months. ° °Lung RADS 3: nodules that are probably non-concerning , includes nodules with a low likelihood of becoming an active cancer.  Recommendation is for a 6-month repeat screening scan. Often noted after an upper respiratory illness. We will be in touch to make sure you have no questions, and to schedule your 6-month scan. ° °Lung RADS 4 A: nodules with concerning findings, recommendation is most often for a follow up scan in 3 months or additional testing based on our provider's assessment of the scan. We will be in touch to make sure you have no questions and to schedule the recommended 3 month follow up scan. ° °Lung RADS 4 B:  indicates findings that are concerning. We will be in touch with you to schedule additional diagnostic testing based on our provider's  assessment of the scan. ° °Hypnosis for smoking cessation  °[employer] °[phone] ° °Acupuncture for smoking cessation  °[facility] °[phone]  °

## 2021-11-29 ENCOUNTER — Ambulatory Visit (INDEPENDENT_AMBULATORY_CARE_PROVIDER_SITE_OTHER): Payer: 59 | Admitting: Primary Care

## 2021-11-29 ENCOUNTER — Other Ambulatory Visit: Payer: Self-pay

## 2021-11-29 DIAGNOSIS — F17200 Nicotine dependence, unspecified, uncomplicated: Secondary | ICD-10-CM | POA: Diagnosis not present

## 2021-11-30 ENCOUNTER — Ambulatory Visit
Admission: RE | Admit: 2021-11-30 | Discharge: 2021-11-30 | Disposition: A | Discharge status: Home / Self Care | Payer: 59 | Source: Ambulatory Visit | Attending: Acute Care | Admitting: Acute Care

## 2021-11-30 DIAGNOSIS — Z87891 Personal history of nicotine dependence: Secondary | ICD-10-CM

## 2021-11-30 DIAGNOSIS — F17210 Nicotine dependence, cigarettes, uncomplicated: Secondary | ICD-10-CM

## 2021-12-01 ENCOUNTER — Telehealth: Payer: Self-pay | Admitting: Acute Care

## 2021-12-01 NOTE — Telephone Encounter (Signed)
This is an FYI on a call report.

## 2021-12-02 ENCOUNTER — Telehealth: Payer: Self-pay | Admitting: Acute Care

## 2021-12-02 NOTE — Telephone Encounter (Signed)
I have called the patient with the results of her low-dose screening CT.  I explained that her scan was read as a lung RADS 4B, suspicious I explained that there was a 20 mm with an 11.6 mm solid component new part solid nodular opacity in her right lower lobe.  Per radiology they feel this may be suspicious for postinfectious sequelae.  Per the patient she has not been sick.  I will have Dr. Tonia Brooms review the scan to determine if we need to treat the patient and repeat scan in 1 month, versus moving on with a PET scan.  I explained to the patient that I would call her back once I have spoken with Dr. Tonia Brooms.  She is in agreement with this plan.

## 2021-12-05 NOTE — Telephone Encounter (Signed)
See telephone note 12/02/2021 °

## 2021-12-08 ENCOUNTER — Telehealth: Payer: Self-pay | Admitting: Acute Care

## 2021-12-08 NOTE — Telephone Encounter (Signed)
Patient has been scheduled and confirmed appt for 12/12/21 at 11:00am video visit.

## 2021-12-08 NOTE — Telephone Encounter (Signed)
Please call patient and see If we can schedule for follow up OV or video visit 12/12/2021. I have 9 and 9:30 slots open. Thanks so much. She needs antibiotic treatment before her follow up scan in 8 weeks.

## 2021-12-09 ENCOUNTER — Other Ambulatory Visit: Payer: Self-pay

## 2021-12-09 ENCOUNTER — Encounter: Payer: 59 | Admitting: *Deleted

## 2021-12-09 DIAGNOSIS — Z006 Encounter for examination for normal comparison and control in clinical research program: Secondary | ICD-10-CM

## 2021-12-09 DIAGNOSIS — R911 Solitary pulmonary nodule: Secondary | ICD-10-CM

## 2021-12-09 NOTE — Research (Signed)
Title: [place]: CliNIcal Utility of ManaGement of Patients witH CT and LDCT Identified Pulmonary Nodules UsinG the Percepta NasAL Swab ClassifiEr -- with Familiarization   Protocol #: [id number] Sponsor: [employer]   Protocol Revision 1 dated 01Sep2022 and confirmed current on today's visit, IRB approved Revision 1 on 29Dec2022.   Objectives:  Primary: To evaluate if use of the Percepta Nasal Swab test in the diagnostic work up of newly identified pulmonary nodules reduces the number of invasive procedures in the group classified as low-risk by the test and that are benign as compared to a control group managed without a Percepta Nasal Swab test result.                   A newly identified nodule is defined as any nodule first identified on imaging                   <90 days prior to nasal sample collection that hasnt undergone a diagnostic                    procedure for the management of their index nodule prior to enrollment.                   CT imaging includes conventional CT, LDCT, HRCT                   Benign diagnosis is defined as a specific diagnosis of a benign condition,                    radiographic resolution or stability at ? 24 months, or no cytological,                    radiological, or pathological evidence of cancer.                   Procedures will be categorized as either invasive or non-invasive in the Data                    Management Plan (DMP). Secondary: To evaluate if use of the Percepta Nasal Swab test in the diagnostic work up of newly identified pulmonary nodules increases the proportion of subjects classified as high-risk by the test and have primary lung cancer that go directly to appropriate therapy as compared to a control group managed without a Percepta Nasal Swab test result.                    Proportion of subjects that go directly to appropriate therapy is defined as those                     subjects that undergo surgery, ablative  or other appropriate therapy as the next                     step after the Percepta Nasal Swab test result without intervening non-surgical                     procedures                             a. Non-surgical procedures include diagnostic PET, but not PET for  staging purposes.                             b. Appropriate therapies will be defined in the CRF.                    A newly identified nodule is defined as any nodule first identified on imaging                    <90 days prior to nasal sample collection.                    Lung cancer diagnosis is defined as established by cytology or pathology, or in                     circumstances where a presumptive diagnosis of cancer led to definitive                     ablative or other appropriate therapy without pathology. Key Inclusion Criteria:  Inclusion Criteria:  Able to tolerate nasal epithelial specimen collection  Signed written Informed Consent obtained  Subject clinical history available for review by sponsor and regulatory agencies  New nodule first identified on imaging < 90 days prior to nasal sample collection (index nodule)  CT report available for index nodule  27 - 87 years of age  Current or former smoker (>100 cigarettes in a lifetime)  Pulmonary nodule ?30 mm detected by CT  Key Exclusion Criteria: Exclusion Criteria  Subject has undergone a diagnostic procedure for the management of their index nodule after the index CT and prior to enrollment  Active cancer (other than non-melanoma skin cancer)  Prior primary lung cancer (prior non-lung cancer acceptable)  Prior participation in this study (i.e., subjects may not be enrolled more than once)  Current active treatment with an investigational device or drug (patients in trial follow up period are okay if intervention phase is complete)  Patient enrolled or planned to be enrolled in another clinical trial that may influence  management of the patients nodule  Concurrent or planned use of tools or tests for assigning lung nodule risk of malignancy (e.g., genomic or proteomic blood tests) other than clinically validated risk calculators  Clinical Research Coordinator / Research RN note : This visit is for Enrollment/ baseline Subject [id number] with DOB: [id number] on 20JAN2023 for the above protocol is an Enrollment Visit and is for purpose of research.    Subject expressed interest and consent in continuing as a study subject. Subject confirmed contact information (e.g. address, telephone, email). Subject thanked for participation in research and contribution to science.     During this visit on 20JAN2023 , the subject reviewed and signed the consent form, provided demographics, and had a nasal swab collected per the above referenced protocol. Please refer to the subject's paper source binder for further details.   PI, Dr. Valeta Harms, was present for consenting process.         Signed by Jaye Beagle

## 2021-12-12 ENCOUNTER — Other Ambulatory Visit: Payer: Self-pay

## 2021-12-12 ENCOUNTER — Encounter: Payer: Self-pay | Admitting: Acute Care

## 2021-12-12 ENCOUNTER — Telehealth (INDEPENDENT_AMBULATORY_CARE_PROVIDER_SITE_OTHER): Payer: 59 | Admitting: Acute Care

## 2021-12-12 DIAGNOSIS — F17210 Nicotine dependence, cigarettes, uncomplicated: Secondary | ICD-10-CM | POA: Diagnosis not present

## 2021-12-12 DIAGNOSIS — Z720 Tobacco use: Secondary | ICD-10-CM

## 2021-12-12 DIAGNOSIS — J069 Acute upper respiratory infection, unspecified: Secondary | ICD-10-CM | POA: Diagnosis not present

## 2021-12-12 DIAGNOSIS — R9389 Abnormal findings on diagnostic imaging of other specified body structures: Secondary | ICD-10-CM | POA: Diagnosis not present

## 2021-12-12 MED ORDER — DOXYCYCLINE HYCLATE 100 MG PO TABS: 100.0000 mg | ORAL_TABLET | Freq: Two times a day (BID) | ORAL | 0 refills | Status: DC

## 2021-12-12 NOTE — Patient Instructions (Addendum)
It is good to see you today. There is an abnormal nodule on your screening scan.  We will treat with Doxycycline 100 mg twice daily x 7 days/ Take with a full glass of water. Eat Activia Yogurt once daily while on antibiotics.  Wear Sunblock if you are in the sun.  Follow up CT Chest in 1 month.  We will call you to get this scheduled. Please continue to work on quitting smoking.  Please contact office for sooner follow up if symptoms do not improve or worsen or seek emergency care

## 2021-12-12 NOTE — Progress Notes (Addendum)
Virtual Visit via Video Note  I connected with Julie Schneider on 12/12/21 at 11:00 AM EST by a video enabled telemedicine application and verified that I am speaking with the correct person using two identifiers.  Location: Patient: At home Provider: [address], [place], [place], [address]    I discussed the limitations of evaluation and management by telemedicine and the availability of in person appointments. The patient expressed understanding and agreed to proceed.  This was a scheduled video visit to review abnormal imaging .   History of Present Illness: Pt. Presents for follow up of an abnormal lung cancer screening scan . She is a current every day smoker, but is working hard to quit.  36.75 pack year smoking history (49 years x 3/4 ppd)Her scan was read as a Lung RADS 4 B indicates suspicious findings , however radiology felt this could reflect  infection or inflammation .Pt. states she has not been sick. Denies fever, chest pain, change in baseline secretions.  I reviewed the scan with Dr. Tonia Julie Schneider, and he feels patient should be treated with antibiotics with a short term follow up CT Chest to re-evaluate the nodule.  Patient is in agreement with this plan. She verbalized understanding of the above. Antibiotics have been sent to the patient's preferred pharmacy.  Of note, patient recently  lost her brother to lung cancer. We spent 3-4 minutes discussing that she is working hard to quit smoking, but that the goal is to quit entirely. She is motivated to quit. We discussed calling 1-800 QUIT NOW for nicotine replacement therapy that is free of cost. We also discussed acupuncture and hypnosis as options to help with smoking cessation. She verbalized understanding.  Observations/Objective: Pt. Is in no distress.  She is able to speak in full sentences without difficulty.  Assessment and Plan: Abnormal Low Dose Screening CT Chest Suspect this could reflect inflammation / infection.   Continues to smoke, but working on quitting Plan There is an abnormal nodule on your screening scan.  We will treat with Doxycycline 100 mg twice daily x 7 days/ Take with a full glass of water. Eat Activia Yogurt once daily while on antibiotics.  Wear Sunblock if you are in the sun.  Follow up CT Chest in 1 month to re-evaluate the nodule of concern.  We will call you to get this scheduled. Please contact office for sooner follow up if symptoms do not improve or worsen or seek emergency care    Follow Up Instructions: Follow up CT Chest in 6 weeks.   Smoking cessation counseling for 3-4 minutes as noted above in the body of the note.    I discussed the assessment and treatment plan with the patient. The patient was provided an opportunity to ask questions and all were answered. The patient agreed with the plan and demonstrated an understanding of the instructions.   The patient was advised to call back or seek an in-person evaluation if the symptoms worsen or if the condition fails to improve as anticipated.  I provided 30 minutes of non-face-to-face time during this encounter.   [name], NP 12/12/2021

## 2021-12-12 NOTE — Addendum Note (Signed)
Addended by: Kandice Robinsons F on: 12/12/2021 03:27 PM   Modules accepted: Orders

## 2021-12-14 NOTE — Telephone Encounter (Signed)
Pt seen in office on 12/12/21

## 2021-12-26 ENCOUNTER — Encounter: Payer: Self-pay | Admitting: Podiatry

## 2021-12-26 ENCOUNTER — Ambulatory Visit: Payer: 59 | Admitting: Podiatry

## 2021-12-26 DIAGNOSIS — M79676 Pain in unspecified toe(s): Secondary | ICD-10-CM

## 2021-12-26 DIAGNOSIS — Z720 Tobacco use: Secondary | ICD-10-CM | POA: Insufficient documentation

## 2021-12-26 DIAGNOSIS — N841 Polyp of cervix uteri: Secondary | ICD-10-CM | POA: Insufficient documentation

## 2021-12-26 DIAGNOSIS — I2510 Atherosclerotic heart disease of native coronary artery without angina pectoris: Secondary | ICD-10-CM | POA: Insufficient documentation

## 2021-12-26 DIAGNOSIS — M1990 Unspecified osteoarthritis, unspecified site: Secondary | ICD-10-CM | POA: Insufficient documentation

## 2021-12-26 DIAGNOSIS — L600 Ingrowing nail: Secondary | ICD-10-CM | POA: Diagnosis not present

## 2021-12-26 DIAGNOSIS — L608 Other nail disorders: Secondary | ICD-10-CM | POA: Diagnosis not present

## 2021-12-26 DIAGNOSIS — F419 Anxiety disorder, unspecified: Secondary | ICD-10-CM | POA: Insufficient documentation

## 2021-12-26 DIAGNOSIS — M8580 Other specified disorders of bone density and structure, unspecified site: Secondary | ICD-10-CM | POA: Insufficient documentation

## 2021-12-26 DIAGNOSIS — E041 Nontoxic single thyroid nodule: Secondary | ICD-10-CM | POA: Insufficient documentation

## 2021-12-26 NOTE — Progress Notes (Signed)
°  Subjective:  Patient ID: Julie Schneider, female    DOB: [birth year],  MRN: [id number]  Chief Complaint  Patient presents with   Nail Problem    Trim nails and they are curling back in and touchy at the tip of the toes and hurts with shoes   66 y.o. female presents with the above complaint. History confirmed with patient.  Objective:  Physical Exam: warm, good capillary refill, no trophic changes or ulcerative lesions, normal DP and PT pulses, and normal sensory exam. Pincer nails with POP bilaterally, worst at hallux/2nd toenails bilat Assessment:   1. Ingrown nail   2. Pain around toenail   3. Pincer nail deformity    Plan:  Patient was evaluated and treated and all questions answered.  Onychodystrophy -Palliative debridement of severe ingrown nails to patient relief.  Procedure: Nail Debridement Type of Debridement: manual, sharp debridement. Instrumentation: Nail nipper, rotary burr. Number of Nails: 10 Disposition: Patient tolerated well without iatrogenic injury.  No follow-ups on file.

## 2022-01-09 ENCOUNTER — Other Ambulatory Visit: Payer: Self-pay

## 2022-01-09 ENCOUNTER — Ambulatory Visit
Admission: RE | Admit: 2022-01-09 | Discharge: 2022-01-09 | Disposition: A | Discharge status: Home / Self Care | Payer: 59 | Source: Ambulatory Visit | Attending: Acute Care | Admitting: Acute Care

## 2022-01-09 DIAGNOSIS — R9389 Abnormal findings on diagnostic imaging of other specified body structures: Secondary | ICD-10-CM

## 2022-01-12 ENCOUNTER — Telehealth: Payer: Self-pay | Admitting: Acute Care

## 2022-01-12 DIAGNOSIS — F17210 Nicotine dependence, cigarettes, uncomplicated: Secondary | ICD-10-CM

## 2022-01-12 NOTE — Telephone Encounter (Signed)
I have called the patient with the results of her follow up CT Chest that was done after treating the patient for a large RLL nodule that was suspicious for infection. Repeat imaging shows previously described opacity of the peripheral anterior right lower lobe is resolved, with residual underlying bandlike scarring. Findings are consistent with resolution of nonspecific infection or inflammation. Based on the new LR recommendations ( presented in Webex 01/11/2022) we will use the step down approach and we will re scan in 6 months to assure continued resolution of this area.  Angelique Blonder, please place order for 6 month follow up low dose CT Chest. Pt. Is in agreement with this plan. Thanks so much

## 2022-01-12 NOTE — Telephone Encounter (Signed)
Order placed for 6 mth f/u lung screening CT per Kandice Robinsons, NP.

## 2022-01-12 NOTE — Addendum Note (Signed)
Addended by: Abigail Miyamoto D on: 01/12/2022 02:19 PM   Modules accepted: Orders

## 2022-06-14 ENCOUNTER — Encounter: Payer: Self-pay | Admitting: Podiatrist

## 2022-06-14 ENCOUNTER — Ambulatory Visit (INDEPENDENT_AMBULATORY_CARE_PROVIDER_SITE_OTHER): Payer: Self-pay | Admitting: Podiatrist

## 2022-06-14 DIAGNOSIS — M79676 Pain in unspecified toe(s): Secondary | ICD-10-CM

## 2022-06-14 DIAGNOSIS — L608 Other nail disorders: Secondary | ICD-10-CM

## 2022-06-14 DIAGNOSIS — B351 Tinea unguium: Secondary | ICD-10-CM

## 2022-06-14 DIAGNOSIS — M79609 Pain in unspecified limb: Secondary | ICD-10-CM

## 2022-06-14 NOTE — Progress Notes (Signed)
Subjective: Julie Schneider is a 66 y.o. female patient who presents to office today with a concern of her pincer great toenails along with long,mildly painful nails  while ambulating in shoes; unable to trim.  Patient denies any new changes in medication or new problems. Patient denies any new cramping, numbness, burning or tingling in the legs or feet.   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 (Patient not taking: Reported on 06/14/2022) 14 tablet 0   glucosamine-chondroitin (COSAMIN DS) 500-400 MG tablet 1 tablet with a meal     meloxicam (MOBIC) 15 MG tablet Take 1 tablet by mouth daily.     Multiple Vitamins-Minerals (CENTRUM SILVER) CHEW 1 tablet orally once a day     sertraline (ZOLOFT) 50 MG tablet Take by mouth.     No current facility-administered medications on file prior to visit.   No Known Allergies   Objective: General: Patient is awake, alert, and oriented x 3 and in no acute distress.  Integument: Skin is warm, dry and supple bilateral. Nails are tender, long, thickened and  dystrophic with subungual debris, consistent with onychomycosis, 1-5 bilateral. No signs  of infection.  Pincer nail deformity bilateral first is noted.  No open lesions or preulcerative lesions present bilateral. Remaining integument unremarkable.  Vasculature:  Dorsalis Pedis pulse 2/4 bilateral. Posterior Tibial pulse  1/4 bilateral.  Capillary fill time <3 sec 1-5 bilateral. Positive hair growth to the level of the digits. Temperature gradient within normal limits. No varicosities present bilateral. No edema present bilateral.   Neurology: The patient has intact sensation measured with a 5.07/10g Semmes Weinstein Monofilament at all pedal sites bilateral . Vibratory sensation diminished bilateral with tuning fork. No Babinski sign present bilateral.   Musculoskeletal: No symptomatic pedal deformities noted bilateral. Muscular strength 5/5 in all lower extremity muscular groups bilateral without pain on range of motion . No tenderness with calf compression bilateral.  Assessment and Plan:   ICD-10-CM   1. Pain due to onychomycosis of nail  B35.1    M79.609     2. Pincer nail deformity  L60.8        -Examined patient. -Mechanically debrided all nails 1-5 bilateral using sterile nail nipper and filed with dremel without incident  -Answered all patient questions -Patient to return  in 3 months for continued foot care.  -Patient advised to call the office if any problems or questions arise in the meantime.  Delories Heinz, DPM

## 2022-06-29 DIAGNOSIS — Z1231 Encounter for screening mammogram for malignant neoplasm of breast: Secondary | ICD-10-CM | POA: Diagnosis not present

## 2022-07-10 ENCOUNTER — Ambulatory Visit
Admission: RE | Admit: 2022-07-10 | Discharge: 2022-07-10 | Disposition: A | Discharge status: Home / Self Care | Payer: Medicare Other | Source: Ambulatory Visit | Attending: Acute Care | Admitting: Acute Care

## 2022-07-10 DIAGNOSIS — F17210 Nicotine dependence, cigarettes, uncomplicated: Secondary | ICD-10-CM | POA: Diagnosis not present

## 2022-07-13 ENCOUNTER — Other Ambulatory Visit: Payer: Self-pay

## 2022-07-13 DIAGNOSIS — Z87891 Personal history of nicotine dependence: Secondary | ICD-10-CM

## 2022-07-13 DIAGNOSIS — Z122 Encounter for screening for malignant neoplasm of respiratory organs: Secondary | ICD-10-CM

## 2022-07-13 DIAGNOSIS — F17210 Nicotine dependence, cigarettes, uncomplicated: Secondary | ICD-10-CM

## 2022-08-18 DIAGNOSIS — H02831 Dermatochalasis of right upper eyelid: Secondary | ICD-10-CM | POA: Diagnosis not present

## 2022-08-18 DIAGNOSIS — H524 Presbyopia: Secondary | ICD-10-CM | POA: Diagnosis not present

## 2022-08-18 DIAGNOSIS — H02834 Dermatochalasis of left upper eyelid: Secondary | ICD-10-CM | POA: Diagnosis not present

## 2022-08-18 DIAGNOSIS — H5203 Hypermetropia, bilateral: Secondary | ICD-10-CM | POA: Diagnosis not present

## 2022-09-27 DIAGNOSIS — J449 Chronic obstructive pulmonary disease, unspecified: Secondary | ICD-10-CM | POA: Diagnosis not present

## 2022-09-27 DIAGNOSIS — Z1159 Encounter for screening for other viral diseases: Secondary | ICD-10-CM | POA: Diagnosis not present

## 2022-09-27 DIAGNOSIS — Z13220 Encounter for screening for lipoid disorders: Secondary | ICD-10-CM | POA: Diagnosis not present

## 2022-09-27 DIAGNOSIS — Z23 Encounter for immunization: Secondary | ICD-10-CM | POA: Diagnosis not present

## 2022-09-27 DIAGNOSIS — I700 Atherosclerosis of aorta: Secondary | ICD-10-CM | POA: Diagnosis not present

## 2022-09-27 DIAGNOSIS — Z720 Tobacco use: Secondary | ICD-10-CM | POA: Diagnosis not present

## 2022-09-27 DIAGNOSIS — L989 Disorder of the skin and subcutaneous tissue, unspecified: Secondary | ICD-10-CM | POA: Diagnosis not present

## 2022-09-27 DIAGNOSIS — E041 Nontoxic single thyroid nodule: Secondary | ICD-10-CM | POA: Diagnosis not present

## 2022-09-27 DIAGNOSIS — I2510 Atherosclerotic heart disease of native coronary artery without angina pectoris: Secondary | ICD-10-CM | POA: Diagnosis not present

## 2022-09-27 DIAGNOSIS — M159 Polyosteoarthritis, unspecified: Secondary | ICD-10-CM | POA: Diagnosis not present

## 2022-09-27 DIAGNOSIS — Z136 Encounter for screening for cardiovascular disorders: Secondary | ICD-10-CM | POA: Diagnosis not present

## 2022-09-27 DIAGNOSIS — Z79899 Other long term (current) drug therapy: Secondary | ICD-10-CM | POA: Diagnosis not present

## 2022-09-27 DIAGNOSIS — Z0000 Encounter for general adult medical examination without abnormal findings: Secondary | ICD-10-CM | POA: Diagnosis not present

## 2022-09-27 DIAGNOSIS — M8580 Other specified disorders of bone density and structure, unspecified site: Secondary | ICD-10-CM | POA: Diagnosis not present

## 2022-09-28 ENCOUNTER — Ambulatory Visit: Payer: Medicare Other | Admitting: Podiatry

## 2022-09-28 ENCOUNTER — Encounter: Payer: Self-pay | Admitting: Podiatry

## 2022-09-28 DIAGNOSIS — B351 Tinea unguium: Secondary | ICD-10-CM

## 2022-09-28 DIAGNOSIS — L608 Other nail disorders: Secondary | ICD-10-CM

## 2022-09-28 DIAGNOSIS — M79609 Pain in unspecified limb: Secondary | ICD-10-CM | POA: Diagnosis not present

## 2022-09-28 NOTE — Progress Notes (Signed)
  Subjective:  Patient ID: Julie Schneider, female    DOB: [birth year],  MRN: [id number]  Julie Schneider presents to clinic today for ingrown toenail to the bilateral great toes  Chief Complaint  Patient presents with   Nail Problem    Nail trim Big toes on both feet  Not Diabetic PCP - Dr Lupita Raider   New problem(s): None.   PCP is Lupita Raider, MD .  No Known Allergies  Review of Systems: Negative except as noted in the HPI.  Objective: No changes noted in today's physical examination.  [name] is a pleasant 66 y.o. female WD, WN in NAD. AAO x 3.  Vascular Examination: CFT <3 seconds b/l. DP pulses palpable b/l. PT pulses faintly palpable b/l. Digital hair present.  Skin temperature gradient warm to warm b/l. No pain with calf compression. No ischemia or gangrene. No cyanosis or clubbing noted b/l.    Neurological Examination: Sensation grossly intact b/l with 10 gram monofilament. Vibratory sensation intact b/l.   Dermatological Examination: Pedal skin warm and supple b/l. Toenails great toes b/l thick, discolored, elongated with subungual debris and pain on dorsal palpation.  No open wounds b/l LE. No interdigital macerations noted b/l LE. No hyperkeratotic nor porokeratotic lesions present on today's visit.  Musculoskeletal Examination: Muscle strength 5/5 to b/l LE. No pain, crepitus or joint limitation noted with ROM bilateral LE. No gross bony deformities bilaterally.  Radiographs: None  Assessment/Plan: 1. Pain due to onychomycosis of nail   2. Pincer nail deformity     No orders of the defined types were placed in this encounter.   -Patient was evaluated and treated. All patient's and/or POA's questions/concerns answered on today's visit. -Toenails bilateral great toes debrided in length and girth without iatrogenic bleeding with sterile nail nipper and dremel.  -Patient/POA to call should there be question/concern in the interim.   Return  in about 3 months (around 12/29/2022).  Freddie Breech, DPM

## 2022-10-02 ENCOUNTER — Encounter: Payer: Self-pay | Admitting: Podiatry

## 2022-12-26 ENCOUNTER — Ambulatory Visit: Payer: Medicare Other | Admitting: Podiatry

## 2022-12-26 DIAGNOSIS — L608 Other nail disorders: Secondary | ICD-10-CM

## 2022-12-26 DIAGNOSIS — M79609 Pain in unspecified limb: Secondary | ICD-10-CM | POA: Diagnosis not present

## 2022-12-26 DIAGNOSIS — B351 Tinea unguium: Secondary | ICD-10-CM

## 2022-12-26 DIAGNOSIS — M79676 Pain in unspecified toe(s): Secondary | ICD-10-CM

## 2022-12-26 NOTE — Progress Notes (Signed)
  Subjective:  Patient ID: Julie Schneider, female    DOB: 06-01-1956,  MRN: [id number]  Chief Complaint  Patient presents with   Nail Problem    Routine Foot Care     67 y.o. female presents with the above complaint. History confirmed with patient. Patient presenting with pain related to dystrophic thickened elongated nails. Patient is unable to trim own nails related to nail dystrophy and/or mobility issues. Patient does not have a history of T2DM.  Objective:  Physical Exam: warm, good capillary refill nail exam onychomycosis of the toenails, onycholysis, and dystrophic nails DP pulses palpable, PT pulses palpable, and protective sensation intact Left Foot:  Pain with palpation of nails due to elongation and dystrophic growth.  Right Foot: Pain with palpation of nails due to elongation and dystrophic growth.   Assessment:   1. Pain due to onychomycosis of nail   2. Pincer nail deformity   3. Pain around toenail   4. Onychomycosis      Plan:  Patient was evaluated and treated and all questions answered.   #Onychomycosis with pain  -Nails palliatively debrided as below. -Educated on self-care  Procedure: Nail Debridement Rationale: Pain Type of Debridement: manual, sharp debridement. Instrumentation: Nail nipper, rotary burr. Number of Nails: 10  Return in about 3 months (around 03/26/2023) for [facility].         Everitt Amber, DPM [facility]

## 2022-12-29 ENCOUNTER — Ambulatory Visit: Payer: Medicare Other | Admitting: Podiatry

## 2023-02-27 ENCOUNTER — Ambulatory Visit: Payer: Medicare Other | Admitting: Podiatry

## 2023-02-27 DIAGNOSIS — M79609 Pain in unspecified limb: Secondary | ICD-10-CM | POA: Diagnosis not present

## 2023-02-27 DIAGNOSIS — B351 Tinea unguium: Secondary | ICD-10-CM | POA: Diagnosis not present

## 2023-02-27 DIAGNOSIS — L608 Other nail disorders: Secondary | ICD-10-CM

## 2023-02-27 NOTE — Progress Notes (Signed)
  Subjective:  Patient ID: Julie Schneider, female    DOB: 1956/09/02,  MRN: [id number]  Chief Complaint  Patient presents with   routine foot care     67 y.o. female presents with the above complaint. History confirmed with patient. Patient presenting with pain related to dystrophic thickened elongated nails. Patient is unable to trim own nails related to nail dystrophy and/or mobility issues. Patient does not have a history of T2DM.  Objective:  Physical Exam: warm, good capillary refill nail exam onychomycosis of the toenails, onycholysis, and dystrophic nails DP pulses palpable, PT pulses palpable, and protective sensation intact Left Foot:  Pain with palpation of nails due to elongation and dystrophic growth.  Right Foot: Pain with palpation of nails due to elongation and dystrophic growth.   Assessment:   1. Pain due to onychomycosis of nail   2. Pincer nail deformity       Plan:  Patient was evaluated and treated and all questions answered.   #Onychomycosis with pain  -Nails palliatively debrided as below. -Educated on self-care  Procedure: Nail Debridement Rationale: Pain Type of Debridement: manual, sharp debridement. Instrumentation: Nail nipper, rotary burr. Number of Nails: 10  Return in about 3 months (around 05/29/2023) for [facility].         Corinna Gab, DPM [facility]

## 2023-03-12 ENCOUNTER — Other Ambulatory Visit (INDEPENDENT_AMBULATORY_CARE_PROVIDER_SITE_OTHER): Payer: Medicare Other

## 2023-03-12 ENCOUNTER — Ambulatory Visit: Payer: Medicare Other | Admitting: Orthopaedic Surgery

## 2023-03-12 DIAGNOSIS — M1612 Unilateral primary osteoarthritis, left hip: Secondary | ICD-10-CM | POA: Diagnosis not present

## 2023-03-12 DIAGNOSIS — M25552 Pain in left hip: Secondary | ICD-10-CM | POA: Diagnosis not present

## 2023-03-12 NOTE — Progress Notes (Signed)
The patient is a very pleasant 67 year old retired [employer] who has been dealing with left hip pain for several years now.  She was told years ago that she would likely need a hip replacement.  She has a complicated history as it relates to her right hip.  She had a [place] resurfacing arthroplasty of that right hip many years ago when she was younger.  Then unfortunately she had a hard mechanical fall in 2017 and fractured all around that hip and pelvis.  She eventually had a total hip arthroplasty on the right side.  That has done well but she does walk with a Trendelenburg gait as result of muscle weakening.  She has been dealing with left hip and groin pain for many years now.  She does walk with a limp to her gait.  She is not a diabetic she is very thin.  She has tried and failed all forms conservative treatment for a long period of time including even strengthening exercises.  She does take meloxicam as anti-inflammatory as well as glucosamine.  There is currently listed no headache, chest pain, shortness of breath, fever, chills, nausea, vomiting.  I was able to review her past medical history and medications within epic.  On exam her left hip has significant stiffness with internal and external rotation as well as pain in the groin with rotation.  There is not a significant leg length discrepancy.  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  There is complete bone-on-bone wear and loss of joint space at the superior lateral joint.  There are sclerotic changes as well as cystic changes and periarticular osteophytes.  This is definitely severe end-stage arthritis.  I did talk to her in length in detail about hip replacement surgery through an anterior approach.  I went over her x-rays with her and showed her hip replacement model.  I gave her handout about hip replacement surgery.  We discussed the risk and benefits of the surgery and what to expect  from an intraoperative and postoperative standpoint.  At this point given the severity of arthritis and her continued pain, she does wish to proceed with hip replacement surgery.  At this point her pain is detrimentally affecting her mobility, her quality of life and actives daily living.  We will be in touch with scheduling surgery.

## 2023-03-28 ENCOUNTER — Other Ambulatory Visit: Payer: Self-pay

## 2023-04-19 ENCOUNTER — Encounter (HOSPITAL_COMMUNITY): Payer: Self-pay

## 2023-04-23 NOTE — Patient Instructions (Signed)
SURGICAL WAITING ROOM VISITATION  Patients having surgery or a procedure may have no more than 2 support people in the waiting area - these visitors may rotate.    Children under the age of 33 must have an adult with them who is not the patient.  Due to an increase in RSV and influenza rates and associated hospitalizations, children ages 34 and under may not visit patients in [facility] hospitals.  If the patient needs to stay at the hospital during part of their recovery, the visitor guidelines for inpatient rooms apply. Pre-op nurse will coordinate an appropriate time for 1 support person to accompany patient in pre-op.  This support person may not rotate.    Please refer to the [facility] website for the visitor guidelines for Inpatients (after your surgery is over and you are in a regular room).       Your procedure is scheduled on:  05/04/2023    Report to [facility] Main Entrance    Report to admitting at   [phone]   Call this number if you have problems the morning of surgery [phone]   Do not eat food :After Midnight.   After Midnight you may have the following liquids until _ 0915_____ Am  DAY OF SURGERY  Water Non-Citrus Juices (without pulp, NO RED-Apple, White grape, White cranberry) Black Coffee (NO MILK/CREAM OR CREAMERS, sugar ok)  Clear Tea (NO MILK/CREAM OR CREAMERS, sugar ok) regular and decaf                             Plain Jell-O (NO RED)                                           Fruit ices (not with fruit pulp, NO RED)                                     Popsicles (NO RED)                                                               Sports drinks like Gatorade (NO RED)                    The day of surgery:  Drink ONE (1) Pre-Surgery Clear Ensure or G2 at   0915AM ( have completed by )  the morning of surgery. Drink in one sitting. Do not sip.  This drink was given to you during your hospital  pre-op appointment visit. Nothing else to  drink after completing the  Pre-Surgery Clear Ensure or G2.          If you have questions, please contact your surgeon's office.        Oral Hygiene is also important to reduce your risk of infection.                                    Remember - BRUSH YOUR TEETH THE MORNING OF  SURGERY WITH YOUR REGULAR TOOTHPASTE  DENTURES WILL BE REMOVED PRIOR TO SURGERY PLEASE DO NOT APPLY "Poly grip" OR ADHESIVES!!!   Do NOT smoke after Midnight   Take these medicines the morning of surgery with A SIP OF WATER:  zoloft   DO NOT TAKE ANY ORAL DIABETIC MEDICATIONS DAY OF YOUR SURGERY  Bring CPAP mask and tubing day of surgery.                              You may not have any metal on your body including hair pins, jewelry, and body piercing             Do not wear make-up, lotions, powders, perfumes/cologne, or deodorant  Do not wear nail polish including gel and S&S, artificial/acrylic nails, or any other type of covering on natural nails including finger and toenails. If you have artificial nails, gel coating, etc. that needs to be removed by a nail salon please have this removed prior to surgery or surgery may need to be canceled/ delayed if the surgeon/ anesthesia feels like they are unable to be safely monitored.   Do not shave  48 hours prior to surgery.               Men may shave face and neck.   Do not bring valuables to the hospital. [facility] IS NOT             RESPONSIBLE   FOR VALUABLES.   Contacts, glasses, dentures or bridgework may not be worn into surgery.   Bring small overnight bag day of surgery.   DO NOT BRING YOUR HOME MEDICATIONS TO THE HOSPITAL. PHARMACY WILL DISPENSE MEDICATIONS LISTED ON YOUR MEDICATION LIST TO YOU DURING YOUR ADMISSION IN THE HOSPITAL!    Patients discharged on the day of surgery will not be allowed to drive home.  Someone NEEDS to stay with you for the first 24 hours after anesthesia.   Special Instructions: Bring a copy of your healthcare  power of attorney and living will documents the day of surgery if you haven't scanned them before.              Please read over the following fact sheets you were given: IF YOU HAVE QUESTIONS ABOUT YOUR PRE-OP INSTRUCTIONS PLEASE CALL [phone]   If you received a COVID test during your pre-op visit  it is requested that you wear a mask when out in public, stay away from anyone that may not be feeling well and notify your surgeon if you develop symptoms. If you test positive for Covid or have been in contact with anyone that has tested positive in the last 10 days please notify you surgeon.      Pre-operative 5 CHG Bath Instructions   You can play a key role in reducing the risk of infection after surgery. Your skin needs to be as free of germs as possible. You can reduce the number of germs on your skin by washing with CHG (chlorhexidine gluconate) soap before surgery. CHG is an antiseptic soap that kills germs and continues to kill germs even after washing.   DO NOT use if you have an allergy to chlorhexidine/CHG or antibacterial soaps. If your skin becomes reddened or irritated, stop using the CHG and notify one of our RNs at [phone].   Please shower with the CHG soap starting 4 days before surgery using the following schedule:  Please keep in mind the following:  DO NOT shave, including legs and underarms, starting the day of your first shower.   You may shave your face at any point before/day of surgery.  Place clean sheets on your bed the day you start using CHG soap. Use a clean washcloth (not used since being washed) for each shower. DO NOT sleep with pets once you start using the CHG.   CHG Shower Instructions:  If you choose to wash your hair and private area, wash first with your normal shampoo/soap.  After you use shampoo/soap, rinse your hair and body thoroughly to remove shampoo/soap residue.  Turn the water OFF and apply about 3 tablespoons (45 ml) of CHG soap  to a CLEAN washcloth.  Apply CHG soap ONLY FROM YOUR NECK DOWN TO YOUR TOES (washing for 3-5 minutes)  DO NOT use CHG soap on face, private areas, open wounds, or sores.  Pay special attention to the area where your surgery is being performed.  If you are having back surgery, having someone wash your back for you may be helpful. Wait 2 minutes after CHG soap is applied, then you may rinse off the CHG soap.  Pat dry with a clean towel  Put on clean clothes/pajamas   If you choose to wear lotion, please use ONLY the CHG-compatible lotions on the back of this paper.     Additional instructions for the day of surgery: DO NOT APPLY any lotions, deodorants, cologne, or perfumes.   Put on clean/comfortable clothes.  Brush your teeth.  Ask your nurse before applying any prescription medications to the skin.      CHG Compatible Lotions   Aveeno Moisturizing lotion  Cetaphil Moisturizing Cream  Cetaphil Moisturizing Lotion  Clairol Herbal Essence Moisturizing Lotion, Dry Skin  Clairol Herbal Essence Moisturizing Lotion, Extra Dry Skin  Clairol Herbal Essence Moisturizing Lotion, Normal Skin  Curel Age Defying Therapeutic Moisturizing Lotion with Alpha Hydroxy  Curel Extreme Care Body Lotion  Curel Soothing Hands Moisturizing Hand Lotion  Curel Therapeutic Moisturizing Cream, Fragrance-Free  Curel Therapeutic Moisturizing Lotion, Fragrance-Free  Curel Therapeutic Moisturizing Lotion, Original Formula  Eucerin Daily Replenishing Lotion  Eucerin Dry Skin Therapy Plus Alpha Hydroxy Crme  Eucerin Dry Skin Therapy Plus Alpha Hydroxy Lotion  Eucerin Original Crme  Eucerin Original Lotion  Eucerin Plus Crme Eucerin Plus Lotion  Eucerin TriLipid Replenishing Lotion  Keri Anti-Bacterial Hand Lotion  Keri Deep Conditioning Original Lotion Dry Skin Formula Softly Scented  Keri Deep Conditioning Original Lotion, Fragrance Free Sensitive Skin Formula  Keri Lotion Fast Absorbing Fragrance Free  Sensitive Skin Formula  Keri Lotion Fast Absorbing Softly Scented Dry Skin Formula  Keri Original Lotion  Keri Skin Renewal Lotion Keri Silky Smooth Lotion  Keri Silky Smooth Sensitive Skin Lotion  Nivea Body Creamy Conditioning Oil  Nivea Body Extra Enriched Teacher, adult education Moisturizing Lotion Nivea Crme  Nivea Skin Firming Lotion  NutraDerm 30 Skin Lotion  NutraDerm Skin Lotion  NutraDerm Therapeutic Skin Cream  NutraDerm Therapeutic Skin Lotion  ProShield Protective Hand Cream  Provon moisturizing lotion

## 2023-04-23 NOTE — Progress Notes (Signed)
Anesthesia Review:  PCP: Cardiologist : Chest x-ray : EKG : Echo : Stress test: Cardiac Cath :  Activity level:  Sleep Study/ CPAP : Fasting Blood Sugar :      / Checks Blood Sugar -- times a day:   Blood Thinner/ Instructions /Last Dose: ASA / Instructions/ Last Dose :  

## 2023-04-25 ENCOUNTER — Encounter (HOSPITAL_COMMUNITY): Payer: Self-pay

## 2023-04-25 ENCOUNTER — Other Ambulatory Visit: Payer: Self-pay

## 2023-04-25 ENCOUNTER — Encounter (HOSPITAL_COMMUNITY)
Admission: RE | Admit: 2023-04-25 | Discharge: 2023-04-25 | Disposition: A | Discharge status: Home / Self Care | Payer: Medicare Other | Source: Ambulatory Visit | Attending: Orthopaedic Surgery | Admitting: Orthopaedic Surgery

## 2023-04-25 VITALS — BP 110/86 | HR 80 | Temp 98.4°F | Resp 16 | Ht 60.0 in | Wt 99.0 lb

## 2023-04-25 DIAGNOSIS — M1612 Unilateral primary osteoarthritis, left hip: Secondary | ICD-10-CM | POA: Diagnosis not present

## 2023-04-25 DIAGNOSIS — Z01818 Encounter for other preprocedural examination: Secondary | ICD-10-CM

## 2023-04-25 HISTORY — DX: Unspecified osteoarthritis, unspecified site: M19.90

## 2023-04-25 LAB — CBC
HCT: 42.9 % (ref 36.0–46.0)
Hemoglobin: 14 g/dL (ref 12.0–15.0)
MCH: 30.5 pg (ref 26.0–34.0)
MCHC: 32.6 g/dL (ref 30.0–36.0)
MCV: 93.5 fL (ref 80.0–100.0)
Platelets: 200 10*3/uL (ref 150–400)
RBC: 4.59 MIL/uL (ref 3.87–5.11)
RDW: 14.4 % (ref 11.5–15.5)
WBC: 8.1 10*3/uL (ref 4.0–10.5)
nRBC: 0 % (ref 0.0–0.2)

## 2023-04-25 LAB — COMPREHENSIVE METABOLIC PANEL
ALT: 19 U/L (ref 0–44)
AST: 28 U/L (ref 15–41)
Albumin: 4 g/dL (ref 3.5–5.0)
Alkaline Phosphatase: 103 U/L (ref 38–126)
Anion gap: 9 (ref 5–15)
BUN: 13 mg/dL (ref 8–23)
CO2: 21 mmol/L — ABNORMAL LOW (ref 22–32)
Calcium: 9.3 mg/dL (ref 8.9–10.3)
Chloride: 106 mmol/L (ref 98–111)
Creatinine, Ser: 0.93 mg/dL (ref 0.44–1.00)
GFR, Estimated: >60 mL/min (ref >60)
Glucose, Bld: 99 mg/dL (ref 70–99)
Potassium: 4.1 mmol/L (ref 3.5–5.1)
Sodium: 136 mmol/L (ref 135–145)
Total Bilirubin: 0.2 mg/dL — ABNORMAL LOW (ref 0.3–1.2)
Total Protein: 6.9 g/dL (ref 6.5–8.1)

## 2023-04-25 LAB — SURGICAL PCR SCREEN
MRSA, PCR: NEGATIVE
Staphylococcus aureus: NEGATIVE

## 2023-05-03 ENCOUNTER — Telehealth: Payer: Self-pay | Admitting: *Deleted

## 2023-05-03 NOTE — Telephone Encounter (Signed)
Ortho bundle pre-op call completed. 

## 2023-05-03 NOTE — H&P (Signed)
TOTAL HIP ADMISSION H&P  Patient is admitted for left total hip arthroplasty.  Subjective:  Chief Complaint: left hip pain  HPI: Julie Schneider, 67 y.o. female, has a history of pain and functional disability in the left hip(s) due to arthritis and patient has failed non-surgical conservative treatments for greater than 12 weeks to include NSAID's and/or analgesics, corticosteriod injections, flexibility and strengthening excercises, use of assistive devices, weight reduction as appropriate, and activity modification.  Onset of symptoms was gradual starting 3 years ago with gradually worsening course since that time.The patient noted no past surgery on the left hip(s).  Patient currently rates pain in the left hip at 10 out of 10 with activity. Patient has night pain, worsening of pain with activity and weight bearing, trendelenberg gait, pain that interfers with activities of daily living, and pain with passive range of motion. Patient has evidence of subchondral cysts, subchondral sclerosis, periarticular osteophytes, and joint space narrowing by imaging studies. This condition presents safety issues increasing the risk of falls.  There is no current active infection.  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   Current Outpatient Medications  Medication Sig Dispense Refill Last Dose   Calcium Citrate-Vitamin D (CITRACAL MAXIMUM PO) Take 2 capsules by mouth daily.      Cholecalciferol (VITAMIN D) 50 MCG (2000 UT) tablet Take 2,000 Units by mouth daily.      glucosamine-chondroitin (COSAMIN DS) 500-400 MG tablet Take 1 tablet by mouth daily.      meloxicam (MOBIC) 15 MG tablet Take 15 mg by mouth daily.      Multiple Vitamins-Minerals (CENTRUM SILVER) CHEW Chew 1 tablet by mouth daily. 1 tablet orally once a day      sertraline (ZOLOFT) 50 MG tablet Take 50 mg by mouth daily.      zinc gluconate 50 MG tablet Take 50 mg by mouth daily.      doxycycline (VIBRA-TABS) 100 MG tablet Take 1 tablet (100 mg total) by mouth 2 (two) times daily. (Patient not taking: Reported on 06/14/2022) 14 tablet 0    No Known Allergies  Social History   Tobacco Use   Smoking status: Every Day    Years: 22    Types: Cigarettes   Smokeless tobacco: Never  Substance Use Topics   Alcohol use: Never    No family history on file.   Review of Systems  Objective:  Physical Exam Vitals reviewed.  Constitutional:      Appearance: Normal appearance. She is normal weight.  HENT:     Head: Normocephalic and atraumatic.  Eyes:     Extraocular Movements: Extraocular movements intact.     Pupils: Pupils are equal, round, and reactive to light.  Cardiovascular:     Rate and Rhythm: Normal rate and regular rhythm.     Pulses: Normal pulses.  Pulmonary:     Effort: Pulmonary effort is normal.     Breath sounds: Normal breath sounds.  Abdominal:     Palpations: Abdomen is soft.  Musculoskeletal:     Cervical back: Normal range of motion and neck supple.     Left hip: Tenderness and bony tenderness present. Decreased range of motion. Decreased strength.  Neurological:     Mental Status: She is alert and oriented to person, place, and time.  Psychiatric:         Behavior: Behavior normal.     Vital signs in last 24 hours:    Labs:   Estimated body mass index is 19.33 kg/m as calculated from the following:   Height as of 04/25/23: 5' (1.524 m).   Weight as of 04/25/23: 44.9 kg.   Imaging Review Plain radiographs demonstrate severe degenerative joint disease of the left hip(s). The bone quality appears to be good for age and reported activity level.      Assessment/Plan:  End stage arthritis, left hip(s)  The patient history, physical examination, clinical judgement of the provider and imaging studies are consistent with end stage degenerative joint disease of the left hip(s) and total hip arthroplasty is deemed medically necessary. The treatment options including medical management, injection therapy, arthroscopy and arthroplasty were discussed at length. The risks and benefits of total hip arthroplasty were presented and reviewed. The risks due to aseptic loosening, infection, stiffness, dislocation/subluxation,  thromboembolic complications and other imponderables were discussed.  The patient acknowledged the explanation, agreed to proceed with the plan and consent was signed. Patient is being admitted for inpatient treatment for surgery, pain control, PT, OT, prophylactic antibiotics, VTE prophylaxis, progressive ambulation and ADL's and discharge planning.The patient is planning to be discharged home with home health services

## 2023-05-03 NOTE — Care Plan (Signed)
[facility] RNCM call to patient to discuss her upcoming Left total hip arthroplasty with Dr. Magnus Ivan on 05/04/23. She is an Ortho bundle patient through [facility] and is agreeable to case management. She lives with her spouse, who will be assisting after surgery. She has a daughter that will be assisting as well. She has a standard walker without wheels, but would like a RW. She has elevated toilets in her home along with a shower bench, handheld shower and grab bars. No other DME will be needed. Anticipate HHPT will be needed after short hospital stay. Referral made to [facility] after choice provided. Reviewed all post op care instructions. Will continue to follow for needs.

## 2023-05-04 ENCOUNTER — Ambulatory Visit (HOSPITAL_BASED_OUTPATIENT_CLINIC_OR_DEPARTMENT_OTHER): Payer: Medicare Other | Admitting: Anesthesiology

## 2023-05-04 ENCOUNTER — Encounter (HOSPITAL_COMMUNITY)
Admission: RE | Disposition: A | Discharge status: Home Health | Payer: Self-pay | Source: Ambulatory Visit | Attending: Orthopaedic Surgery

## 2023-05-04 ENCOUNTER — Other Ambulatory Visit: Payer: Self-pay

## 2023-05-04 ENCOUNTER — Encounter (HOSPITAL_COMMUNITY): Payer: Self-pay | Admitting: Orthopaedic Surgery

## 2023-05-04 ENCOUNTER — Observation Stay (HOSPITAL_COMMUNITY): Payer: Medicare Other

## 2023-05-04 ENCOUNTER — Ambulatory Visit (HOSPITAL_COMMUNITY): Payer: Medicare Other | Admitting: Physician Assistant

## 2023-05-04 ENCOUNTER — Observation Stay (HOSPITAL_COMMUNITY)
Admission: RE | Admit: 2023-05-04 | Discharge: 2023-05-05 | Disposition: A | Discharge status: Home Health | Payer: Medicare Other | Source: Ambulatory Visit | Attending: Orthopaedic Surgery | Admitting: Orthopaedic Surgery

## 2023-05-04 ENCOUNTER — Ambulatory Visit (HOSPITAL_COMMUNITY): Payer: Medicare Other

## 2023-05-04 DIAGNOSIS — Z471 Aftercare following joint replacement surgery: Secondary | ICD-10-CM | POA: Diagnosis not present

## 2023-05-04 DIAGNOSIS — Z96642 Presence of left artificial hip joint: Secondary | ICD-10-CM

## 2023-05-04 DIAGNOSIS — Z01818 Encounter for other preprocedural examination: Secondary | ICD-10-CM

## 2023-05-04 DIAGNOSIS — F17210 Nicotine dependence, cigarettes, uncomplicated: Secondary | ICD-10-CM | POA: Insufficient documentation

## 2023-05-04 DIAGNOSIS — M1612 Unilateral primary osteoarthritis, left hip: Secondary | ICD-10-CM

## 2023-05-04 DIAGNOSIS — Z96649 Presence of unspecified artificial hip joint: Secondary | ICD-10-CM | POA: Insufficient documentation

## 2023-05-04 DIAGNOSIS — Z79899 Other long term (current) drug therapy: Secondary | ICD-10-CM | POA: Insufficient documentation

## 2023-05-04 DIAGNOSIS — I2510 Atherosclerotic heart disease of native coronary artery without angina pectoris: Secondary | ICD-10-CM

## 2023-05-04 DIAGNOSIS — F419 Anxiety disorder, unspecified: Secondary | ICD-10-CM

## 2023-05-04 HISTORY — PX: TOTAL HIP ARTHROPLASTY: SHX124

## 2023-05-04 LAB — TYPE AND SCREEN
ABO/RH(D): O NEG
Antibody Screen: NEGATIVE

## 2023-05-04 LAB — ABO/RH: ABO/RH(D): O NEG

## 2023-05-04 SURGERY — ARTHROPLASTY, HIP, TOTAL, ANTERIOR APPROACH
Anesthesia: Spinal | Site: Hip | Laterality: Left

## 2023-05-04 MED ORDER — STERILE WATER FOR IRRIGATION IR SOLN
Status: DC | PRN
  Administered 2023-05-04: 2000 mL

## 2023-05-04 MED ORDER — METOCLOPRAMIDE HCL 5 MG PO TABS: 5.0000 mg | ORAL_TABLET | Freq: Three times a day (TID) | ORAL | Status: DC | PRN

## 2023-05-04 MED ORDER — ORAL CARE MOUTH RINSE: 15.0000 mL | Freq: Once | OROMUCOSAL | Status: AC

## 2023-05-04 MED ORDER — OXYCODONE HCL 5 MG PO TABS: 5.0000 mg | ORAL_TABLET | Freq: Once | ORAL | Status: DC | PRN

## 2023-05-04 MED ORDER — HYDROMORPHONE HCL 1 MG/ML IJ SOLN: 0.5000 mg | INTRAMUSCULAR | Status: DC | PRN

## 2023-05-04 MED ORDER — ONDANSETRON HCL 4 MG/2ML IJ SOLN: 4.0000 mg | Freq: Four times a day (QID) | INTRAMUSCULAR | Status: DC | PRN

## 2023-05-04 MED ORDER — PHENYLEPHRINE HCL-NACL 20-0.9 MG/250ML-% IV SOLN
INTRAVENOUS | Status: DC | PRN
  Administered 2023-05-04: 30 ug/min via INTRAVENOUS

## 2023-05-04 MED ORDER — CEFAZOLIN SODIUM-DEXTROSE 1-4 GM/50ML-% IV SOLN
1.0000 g | Freq: Four times a day (QID) | INTRAVENOUS | Status: AC
  Administered 2023-05-04 (×2): 1 g via INTRAVENOUS
  Filled 2023-05-04 (×2): qty 50

## 2023-05-04 MED ORDER — METHOCARBAMOL 500 MG PO TABS
500.0000 mg | ORAL_TABLET | Freq: Four times a day (QID) | ORAL | Status: DC | PRN
  Administered 2023-05-04 – 2023-05-05 (×3): 500 mg via ORAL
  Filled 2023-05-04 (×3): qty 1

## 2023-05-04 MED ORDER — VITAMIN D 25 MCG (1000 UNIT) PO TABS
2000.0000 [IU] | ORAL_TABLET | Freq: Every day | ORAL | Status: DC
  Administered 2023-05-04 – 2023-05-05 (×2): 2000 [IU] via ORAL
  Filled 2023-05-04 (×2): qty 2

## 2023-05-04 MED ORDER — POVIDONE-IODINE 10 % EX SWAB: 2.0000 | Freq: Once | CUTANEOUS | Status: DC

## 2023-05-04 MED ORDER — ASPIRIN 81 MG PO CHEW
81.0000 mg | CHEWABLE_TABLET | Freq: Two times a day (BID) | ORAL | Status: DC
  Administered 2023-05-04 – 2023-05-05 (×2): 81 mg via ORAL
  Filled 2023-05-04 (×2): qty 1

## 2023-05-04 MED ORDER — DIPHENHYDRAMINE HCL 12.5 MG/5ML PO ELIX: 12.5000 mg | ORAL_SOLUTION | ORAL | Status: DC | PRN

## 2023-05-04 MED ORDER — ZINC GLUCONATE 50 MG PO TABS: 50.0000 mg | ORAL_TABLET | Freq: Every day | ORAL | Status: DC

## 2023-05-04 MED ORDER — PHENYLEPHRINE HCL (PRESSORS) 10 MG/ML IV SOLN
INTRAVENOUS | Status: AC
  Filled 2023-05-04: qty 1

## 2023-05-04 MED ORDER — SODIUM CHLORIDE 0.9 % IR SOLN
Status: DC | PRN
  Administered 2023-05-04: 1000 mL

## 2023-05-04 MED ORDER — SERTRALINE HCL 50 MG PO TABS
50.0000 mg | ORAL_TABLET | Freq: Every day | ORAL | Status: DC
  Administered 2023-05-04: 50 mg via ORAL
  Filled 2023-05-04 (×2): qty 1

## 2023-05-04 MED ORDER — METOCLOPRAMIDE HCL 5 MG/ML IJ SOLN: 5.0000 mg | Freq: Three times a day (TID) | INTRAMUSCULAR | Status: DC | PRN

## 2023-05-04 MED ORDER — ONDANSETRON HCL 4 MG/2ML IJ SOLN
4.0000 mg | Freq: Once | INTRAMUSCULAR | Status: AC | PRN
  Administered 2023-05-04: 4 mg via INTRAVENOUS

## 2023-05-04 MED ORDER — LIDOCAINE HCL (PF) 2 % IJ SOLN
INTRAMUSCULAR | Status: AC
  Filled 2023-05-04: qty 5

## 2023-05-04 MED ORDER — MENTHOL 3 MG MT LOZG: 1.0000 | LOZENGE | OROMUCOSAL | Status: DC | PRN

## 2023-05-04 MED ORDER — BUPIVACAINE IN DEXTROSE 0.75-8.25 % IT SOLN
INTRATHECAL | Status: DC | PRN
  Administered 2023-05-04: 1.6 mL via INTRATHECAL

## 2023-05-04 MED ORDER — PROPOFOL 10 MG/ML IV BOLUS
INTRAVENOUS | Status: DC | PRN
  Administered 2023-05-04: 80 ug/kg/min via INTRAVENOUS

## 2023-05-04 MED ORDER — ONDANSETRON HCL 4 MG PO TABS: 4.0000 mg | ORAL_TABLET | Freq: Four times a day (QID) | ORAL | Status: DC | PRN

## 2023-05-04 MED ORDER — ONDANSETRON HCL 4 MG/2ML IJ SOLN
INTRAMUSCULAR | Status: AC
  Filled 2023-05-04: qty 2

## 2023-05-04 MED ORDER — CHLORHEXIDINE GLUCONATE 0.12 % MT SOLN
15.0000 mL | Freq: Once | OROMUCOSAL | Status: AC
  Administered 2023-05-04: 15 mL via OROMUCOSAL

## 2023-05-04 MED ORDER — DROPERIDOL 2.5 MG/ML IJ SOLN: 0.6250 mg | Freq: Once | INTRAMUSCULAR | Status: DC | PRN

## 2023-05-04 MED ORDER — METHOCARBAMOL 1000 MG/10ML IJ SOLN: 500.0000 mg | Freq: Four times a day (QID) | INTRAVENOUS | Status: DC | PRN

## 2023-05-04 MED ORDER — ACETAMINOPHEN 325 MG PO TABS: 325.0000 mg | ORAL_TABLET | Freq: Four times a day (QID) | ORAL | Status: DC | PRN

## 2023-05-04 MED ORDER — PROPOFOL 500 MG/50ML IV EMUL
INTRAVENOUS | Status: DC | PRN
  Administered 2023-05-04: 80 mg via INTRAVENOUS

## 2023-05-04 MED ORDER — LIDOCAINE HCL (PF) 2 % IJ SOLN
INTRAMUSCULAR | Status: DC | PRN
  Administered 2023-05-04: 50 mg via INTRADERMAL

## 2023-05-04 MED ORDER — 0.9 % SODIUM CHLORIDE (POUR BTL) OPTIME
TOPICAL | Status: DC | PRN
  Administered 2023-05-04: 1000 mL

## 2023-05-04 MED ORDER — DEXAMETHASONE SODIUM PHOSPHATE 10 MG/ML IJ SOLN
INTRAMUSCULAR | Status: AC
  Filled 2023-05-04: qty 1

## 2023-05-04 MED ORDER — PHENOL 1.4 % MT LIQD: 1.0000 | OROMUCOSAL | Status: DC | PRN

## 2023-05-04 MED ORDER — DEXAMETHASONE SODIUM PHOSPHATE 10 MG/ML IJ SOLN
INTRAMUSCULAR | Status: DC | PRN
  Administered 2023-05-04: 5 mg via INTRAVENOUS

## 2023-05-04 MED ORDER — OXYCODONE HCL 5 MG PO TABS
5.0000 mg | ORAL_TABLET | ORAL | Status: DC | PRN
  Administered 2023-05-04 – 2023-05-05 (×3): 5 mg via ORAL
  Filled 2023-05-04 (×4): qty 1

## 2023-05-04 MED ORDER — TRANEXAMIC ACID-NACL 1000-0.7 MG/100ML-% IV SOLN
1000.0000 mg | INTRAVENOUS | Status: AC
  Administered 2023-05-04: 1000 mg via INTRAVENOUS
  Filled 2023-05-04: qty 100

## 2023-05-04 MED ORDER — DOCUSATE SODIUM 100 MG PO CAPS
100.0000 mg | ORAL_CAPSULE | Freq: Two times a day (BID) | ORAL | Status: DC
  Administered 2023-05-04 – 2023-05-05 (×2): 100 mg via ORAL
  Filled 2023-05-04 (×2): qty 1

## 2023-05-04 MED ORDER — LACTATED RINGERS IV SOLN: INTRAVENOUS | Status: DC

## 2023-05-04 MED ORDER — HYDROMORPHONE HCL 1 MG/ML IJ SOLN: 0.2500 mg | INTRAMUSCULAR | Status: DC | PRN

## 2023-05-04 MED ORDER — SODIUM CHLORIDE 0.9 % IV SOLN: INTRAVENOUS | Status: DC

## 2023-05-04 MED ORDER — OXYCODONE HCL 5 MG/5ML PO SOLN: 5.0000 mg | Freq: Once | ORAL | Status: DC | PRN

## 2023-05-04 MED ORDER — MIDAZOLAM HCL 2 MG/2ML IJ SOLN
INTRAMUSCULAR | Status: AC
  Filled 2023-05-04: qty 2

## 2023-05-04 MED ORDER — CEFAZOLIN SODIUM-DEXTROSE 2-4 GM/100ML-% IV SOLN
2.0000 g | INTRAVENOUS | Status: AC
  Administered 2023-05-04: 2 g via INTRAVENOUS
  Filled 2023-05-04: qty 100

## 2023-05-04 MED ORDER — MIDAZOLAM HCL 5 MG/5ML IJ SOLN
INTRAMUSCULAR | Status: DC | PRN
  Administered 2023-05-04: 2 mg via INTRAVENOUS

## 2023-05-04 MED ORDER — OXYCODONE HCL 5 MG PO TABS: 10.0000 mg | ORAL_TABLET | ORAL | Status: DC | PRN

## 2023-05-04 MED ORDER — PROPOFOL 1000 MG/100ML IV EMUL
INTRAVENOUS | Status: AC
  Filled 2023-05-04: qty 100

## 2023-05-04 MED ORDER — PANTOPRAZOLE SODIUM 40 MG PO TBEC
40.0000 mg | DELAYED_RELEASE_TABLET | Freq: Every day | ORAL | Status: DC
  Administered 2023-05-04 – 2023-05-05 (×2): 40 mg via ORAL
  Filled 2023-05-04 (×2): qty 1

## 2023-05-04 MED ORDER — ALUM & MAG HYDROXIDE-SIMETH 200-200-20 MG/5ML PO SUSP: 30.0000 mL | ORAL | Status: DC | PRN

## 2023-05-04 SURGICAL SUPPLY — 42 items
APL SKNCLS STERI-STRIP NONHPOA (GAUZE/BANDAGES/DRESSINGS)
BAG COUNTER SPONGE SURGICOUNT (BAG) ×2 IMPLANT
BAG SPEC THK2 15X12 ZIP CLS (MISCELLANEOUS)
BAG SPNG CNTER NS LX DISP (BAG) ×1
BAG ZIPLOCK 12X15 (MISCELLANEOUS) IMPLANT
BENZOIN TINCTURE PRP APPL 2/3 (GAUZE/BANDAGES/DRESSINGS) IMPLANT
BLADE SAW SGTL 18X1.27X75 (BLADE) ×2 IMPLANT
COVER PERINEAL POST (MISCELLANEOUS) ×2 IMPLANT
COVER SURGICAL LIGHT HANDLE (MISCELLANEOUS) ×2 IMPLANT
CUP SECTOR GRIPTON 50MM (Cup) IMPLANT
DRAPE FOOT SWITCH (DRAPES) ×2 IMPLANT
DRAPE STERI IOBAN 125X83 (DRAPES) ×2 IMPLANT
DRAPE U-SHAPE 47X51 STRL (DRAPES) ×4 IMPLANT
DRSG AQUACEL AG ADV 3.5X10 (GAUZE/BANDAGES/DRESSINGS) ×2 IMPLANT
DURAPREP 26ML APPLICATOR (WOUND CARE) ×2 IMPLANT
ELECT REM PT RETURN 15FT ADLT (MISCELLANEOUS) ×2 IMPLANT
GAUZE XEROFORM 1X8 LF (GAUZE/BANDAGES/DRESSINGS) IMPLANT
GLOVE BIO SURGEON STRL SZ7.5 (GLOVE) ×2 IMPLANT
GLOVE BIOGEL PI IND STRL 8 (GLOVE) ×4 IMPLANT
GLOVE ECLIPSE 8.0 STRL XLNG CF (GLOVE) ×2 IMPLANT
GOWN STRL REUS W/ TWL XL LVL3 (GOWN DISPOSABLE) ×4 IMPLANT
GOWN STRL REUS W/TWL XL LVL3 (GOWN DISPOSABLE) ×2
HANDPIECE INTERPULSE COAX TIP (DISPOSABLE) ×1
HEAD FEMORAL 32 CERAMIC (Hips) IMPLANT
HOLDER FOLEY CATH W/STRAP (MISCELLANEOUS) ×2 IMPLANT
KIT TURNOVER KIT A (KITS) IMPLANT
LINER ACET PNNCL PLUS4 NEUTRAL (Hips) IMPLANT
PACK ANTERIOR HIP CUSTOM (KITS) ×2 IMPLANT
PINNACLE PLUS 4 NEUTRAL (Hips) ×1 IMPLANT
SET HNDPC FAN SPRY TIP SCT (DISPOSABLE) ×2 IMPLANT
STAPLER VISISTAT 35W (STAPLE) IMPLANT
STEM FEM ACTIS STD SZ4 (Stem) IMPLANT
STRIP CLOSURE SKIN 1/2X4 (GAUZE/BANDAGES/DRESSINGS) IMPLANT
SUT ETHIBOND NAB CT1 #1 30IN (SUTURE) ×2 IMPLANT
SUT ETHILON 2 0 PS N (SUTURE) IMPLANT
SUT MNCRL AB 4-0 PS2 18 (SUTURE) IMPLANT
SUT VIC AB 0 CT1 36 (SUTURE) ×2 IMPLANT
SUT VIC AB 1 CT1 36 (SUTURE) ×2 IMPLANT
SUT VIC AB 2-0 CT1 27 (SUTURE) ×2
SUT VIC AB 2-0 CT1 TAPERPNT 27 (SUTURE) ×4 IMPLANT
TRAY FOLEY MTR SLVR 16FR STAT (SET/KITS/TRAYS/PACK) IMPLANT
YANKAUER SUCT BULB TIP NO VENT (SUCTIONS) ×2 IMPLANT

## 2023-05-04 NOTE — TOC Transition Note (Signed)
Transition of Care [facility]) - CM/SW Discharge Note  Patient Details  Name: Julie Schneider MRN: [id number] Date of Birth: 1955/12/24  [facility] of [facility] CM/SW Contact:  Ewing Schlein, LCSW Phone Number: 05/04/2023, 3:55 PM  Clinical Narrative: Patient is expected to discharge home after working with PT. CSW met with patient to confirm discharge plan and needs. Patient will go home with HHPT, which was prearranged with [facility]. Patient will need a youth rolling walker. CSW made DME referral to Zack with [employer]. [employer] to deliver youth rolling walker to room. TOC signing off.  Final next level of care: Home w Home Health Services Barriers to Discharge: No Barriers Identified  Patient Goals and CMS Choice CMS Medicare.gov Compare Post Acute Care list provided to:: Patient Choice offered to / list presented to : Patient  Discharge Plan and Services Additional resources added to the After Visit Summary for         DME Arranged: Walker youth DME Agency: [employer] Date DME Agency Contacted: 05/04/23 Time DME Agency Contacted: [phone] Representative spoke with at DME Agency: Zack HH Arranged: PT HH Agency: [facility] Representative spoke with at [facility] Agency: Prearranged in orthopedist's office  Social Determinants of Health (SDOH) Interventions SDOH Screenings   Food Insecurity: No Food Insecurity (05/04/2023)  Housing: Low Risk  (05/04/2023)  Transportation Needs: No Transportation Needs (05/04/2023)  Utilities: Not At Risk (05/04/2023)  Tobacco Use: High Risk (05/04/2023)   Readmission Risk Interventions     No data to display

## 2023-05-04 NOTE — Op Note (Signed)
Operative Note  Date of operation: 05/04/2023 Preoperative diagnosis: Left hip primary osteoarthritis Postoperative diagnosis: Same  Procedure: Left direct anterior total hip arthroplasty  Implants: Implant Name Type Inv. Item Serial No. Manufacturer Lot No. LRB No. Used Action  CUP SECTOR GRIPTON - [id number] Cup CUP SECTOR GRIPTON  [employer]PU[employer]TH[employer] [id number] Left 1 Implanted  PINNACLE PLUS 4 NEUTRAL - [id number] Hips PINNACLE PLUS 4 NEUTRAL  DEPUY ORTHOPAEDICS [id number] Left 1 Implanted  STEM FEM ACTIS STD SZ4 - [id number] Stem STEM FEM ACTIS STD SZ4  DEPUY ORTHOPA[employer] [id number] Left 1 Implanted  HEAD FEMORAL 32 CERAMIC - [id number] Hips HEAD FEMORAL 32 CERAMIC  DEPUY ORTH[employer] [id number] Left 1 Implanted   Surgeon: Vanita Panda. Magnus Ivan, MD Assistant: Darron Doom, RNFA  Anesthesia: Spinal EBL: 300 cc Antibiotics: IV Ancef Complications: None  Indications: The patient is an active 67 year old female with well-documented debilitating arthritis involving her left hip.  Her x-rays show bone-on-bone wear and complete loss of the joint space of the left hip.  Her pain is daily and it is 10 out of 10 at times.  It is detrimentally affecting her mobility, her quality of life and her actives daily living.  She has failed conservative treatment for over a year now.  She does have a history of a right total hip arthroplasty.  She originally had a right hip resurfacing arthroplasty and had some type of fracture and eventually had a posterior hip replacement on that side.  She is fully aware of the risks and benefits of the surgery including the risk of acute blood loss anemia, nerve and vessel injury, fracture, infection, DVT, dislocation, leg length differences and wound healing issues.  She understands her goals are hopefully decrease pain, improve mobility, and improved quality of life.  Procedure description: After informed consent was obtained and the appropriate left hip was marked,  the patient was brought to the operating room and sat up on the stretcher where spinal anesthesia was obtained.  She was then laid in supine position on stretcher and I assessed her leg lengths.  Traction boots were placed on both her feet and she was placed supine on the Hana fracture table with a perineal post in place in both legs and inline skeletal traction devices but no traction applied.  Her left operative hip and pelvis were assessed radiographically.  The left hip was then prepped and draped with DuraPrep and sterile drapes.  A timeout was called and she was identified as the correct patient the correct left hip.  An incision was then made just inferior and posterior to the ASIS and carried slightly obliquely down the leg.  Dissection was carried down to the tensor fascia lata muscle and tensor fascia was then divided longitudinally to proceed with a direct interposed the hip.  Circumflex vessels were identified and cauterized.  The hip capsule was identified and opened up in L-type format finding a moderate joint effusion.  Cobra retractors were placed around the medial and lateral femoral neck and a femoral neck cut was made with an oscillating saw just proximal to the lesser trochanter and this Was completed with an osteotome.  A corkscrew guide was placed in the femoral head and the femoral head was removed its entirety and there was a wide area devoid of cartilage.  A bent Hohmann was then placed over the medial acetabular rim and remnants of the acetabular labrum and other debris removed.  Reaming was then initiated from a size 43 reamer and stepwise  increments going up to a size 49 reamer with all reamers placed under direct visualization and the last reamer was placed under direct fluoroscopy as well in order to obtain the depth of reaming, the inclination and the anteversion.  The real DePuy sector GRIPTION acetabular opponent size 50 was then placed without difficulty followed by a 32+4 polythene  liner based on medialization of the cup and offset.  Attention was then turned to the femur.  The left leg was externally rotated to 120 degrees, extended and adducted.  A Mueller retractor was placed medially and a Hohmann retractor was placed behind the greater trochanter.  The lateral joint capsule was released and a box cutting osteotome was used into the femoral canal.  Broaching was then initiated using the Actis broaching system from a size 0 broach going up to a size 4.  With a size 4 broach in place we trialed a standard offset femoral neck and a 32+1 trial hip ball.  The leg was brought over and up and with traction and internal rotation reduced in the pelvis.  We are pleased with stability on clinical exam and radiographic assessment.  Although she seems to have less offset interoperatively we know from her standing films prior to surgery that her offsets are closer to equal.  Based on the tightening I felt that she did not need a high offset stem.  I was pleased with her leg lengths as well.  We dislocated the hip and remove the trial components.  I placed the real Actis femoral component with standard offset size 4 and the real 32+1 ceramic hip ball.  Again this was reduced into the pelvis and we are pleased with stability, leg length, offset and range of motion assessed clinically and radiographically.  The soft tissue was then irrigated with normal saline solution.  The joint capsule was closed with interrupted #1 Ethibond suture followed by #1 Vicryl to close the tensor fascia.  0 Vicryl was used to close the deep tissue and 2-0 Vicryl was used to close the subcutaneous tissue.  The skin was closed with staples.  An Aquacel dressing was applied.  The patient was taken off the Hana table and taken recovery in stable condition.

## 2023-05-04 NOTE — Evaluation (Signed)
Physical Therapy Evaluation Patient Details Name: Julie Schneider MRN: [id number] DOB: 1955/12/25 Today's Date: 05/04/2023  History of Present Illness  67 yo female presents to therapy s/p L THA, anterior approach on 05/04/2023 due to failure of conservative measures. Pt PMH includes but is not limited to: coronary arteriosclerosis, thyroid nodule, tobacco abuse, acetabular fx (2014), and R THA (2015).  Clinical Impression    Julie Schneider is a 67 y.o. female POD 0 s/p L THA. Patient reports IND  with mobility at baseline. Patient is now limited by functional impairments (see PT problem list below) and requires S for bed mobility and min guard and cues for transfers. Patient was able to ambulate 55 feet with RW and min guard level of assist. Patient instructed in exercise to facilitate ROM and circulation to manage edema. Patient will benefit from continued skilled PT interventions to address impairments and progress towards PLOF. Acute PT will follow to progress mobility and stair training in preparation for safe discharge home with family support and [facility] services.       Recommendations for follow up therapy are one component of a multi-disciplinary discharge planning process, led by the attending physician.  Recommendations may be updated based on patient status, additional functional criteria and insurance authorization.  Follow Up Recommendations       Assistance Recommended at Discharge Intermittent Supervision/Assistance  Patient can return home with the following  A little help with walking and/or transfers;A little help with bathing/dressing/bathroom;Assistance with cooking/housework;Assist for transportation;Help with stairs or ramp for entrance    Equipment Recommendations Rolling walker (2 wheels) (youth)  Recommendations for Other Services       Functional Status Assessment Patient has had a recent decline in their functional status and demonstrates the ability to make  significant improvements in function in a reasonable and predictable amount of time.     Precautions / Restrictions Precautions Precautions: Fall Restrictions Weight Bearing Restrictions: No      Mobility  Bed Mobility Overal bed mobility: Needs Assistance Bed Mobility: Supine to Sit     Supine to sit: Supervision, HOB elevated     General bed mobility comments: min cues    Transfers Overall transfer level: Needs assistance Equipment used: Rolling walker (2 wheels) Transfers: Sit to/from Stand Sit to Stand: Min guard           General transfer comment: min cues for proper UE and AD placement    Ambulation/Gait Ambulation/Gait assistance: Min guard Gait Distance (Feet): 55 Feet Assistive device: Rolling walker (2 wheels) (youth) Gait Pattern/deviations: Step-to pattern, Antalgic Gait velocity: decreased        Stairs            Wheelchair Mobility    Modified Rankin (Stroke Patients Only)       Balance Overall balance assessment: Needs assistance Sitting-balance support: Feet supported Sitting balance-Leahy Scale: Good     Standing balance support: Bilateral upper extremity supported, During functional activity, Reliant on assistive device for balance Standing balance-Leahy Scale: Fair Standing balance comment: static standing no UE uspport                             Pertinent Vitals/Pain Pain Assessment Pain Assessment: 0-10 Pain Score: 3  Pain Location: L hip Pain Descriptors / Indicators: Aching, Constant, Dull, Operative site guarding Pain Intervention(s): Limited activity within patient's tolerance, Monitored during session, Premedicated before session, Repositioned, Ice applied    Home Living Family/patient  expects to be discharged to:: Private residence Living Arrangements: Spouse/significant other Available Help at Discharge: Family (daughter will assist at time of d/c, husband requires S and A at baseline due to  PD) Type of Home: House Home Access: Stairs to enter Entrance Stairs-Rails: Right Entrance Stairs-Number of Steps: 3 Alternate Level Stairs-Number of Steps: flight Home Layout: Two level;Able to live on main level with bedroom/bathroom (basement) Home Equipment: Rollator (4 wheels);Cane - single point      Prior Function Prior Level of Function : Independent/Modified Independent;Driving             Mobility Comments: IND with all ADLs, self care task and IADLs, occational use of SPC       Hand Dominance        Extremity/Trunk Assessment        Lower Extremity Assessment Lower Extremity Assessment: LLE deficits/detail LLE Deficits / Details: ankle DF/PF 5/5 LLE Sensation: WNL    Cervical / Trunk Assessment Cervical / Trunk Assessment:  (wfl)  Communication   Communication: No difficulties  Cognition Arousal/Alertness: Awake/alert Behavior During Therapy: WFL for tasks assessed/performed Overall Cognitive Status: Within Functional Limits for tasks assessed                                          General Comments      Exercises Total Joint Exercises Ankle Circles/Pumps: AROM, Both, 20 reps   Assessment/Plan    PT Assessment Patient needs continued PT services  PT Problem List Decreased strength;Decreased range of motion;Decreased activity tolerance;Decreased balance;Decreased mobility;Pain       PT Treatment Interventions DME instruction;Gait training;Stair training;Functional mobility training;Therapeutic activities;Therapeutic exercise;Balance training;Neuromuscular re-education;Patient/family education;Modalities    PT Goals (Current goals can be found in the Care Plan section)  Acute Rehab PT Goals Patient Stated Goal: to start a walking program, drink more water and quit smoking PT Goal Formulation: With patient Time For Goal Achievement: 05/18/23 Potential to Achieve Goals: Good    Frequency 7X/week     Co-evaluation                AM-PAC PT "6 Clicks" Mobility  Outcome Measure Help needed turning from your back to your side while in a flat bed without using bedrails?: A Little Help needed moving from lying on your back to sitting on the side of a flat bed without using bedrails?: A Little Help needed moving to and from a bed to a chair (including a wheelchair)?: A Little Help needed standing up from a chair using your arms (e.g., wheelchair or bedside chair)?: A Little Help needed to walk in hospital room?: A Little Help needed climbing 3-5 steps with a railing? : A Lot 6 Click Score: 17    End of Session Equipment Utilized During Treatment: Gait belt Activity Tolerance: Patient tolerated treatment well;No increased pain Patient left: in chair;with call bell/phone within reach;with family/visitor present Nurse Communication: Mobility status PT Visit Diagnosis: Unsteadiness on feet (R26.81);Other abnormalities of gait and mobility (R26.89);Muscle weakness (generalized) (M62.81);Pain Pain - Right/Left: Left Pain - part of body: Hip    Time: 1610-9604 PT Time Calculation (min) (ACUTE ONLY): 31 min   Charges:   PT Evaluation $PT Eval Low Complexity: 1 Low PT Treatments $Gait Training: 8-22 mins        Johnny Bridge, PT Acute Rehab   Jacqualyn Posey 05/04/2023, 6:25 PM

## 2023-05-04 NOTE — Interval H&P Note (Signed)
History and Physical Interval Note: The patient understands that she is here today for a left total hip replacement to treat her severe left hip arthritis.  There has been no acute or interval change in her medical status.  The risks and benefits of surgery have been discussed in detail and informed consent has been obtained.  The left operative hip has been marked.  05/04/2023 8:29 AM  Julie Schneider  has presented today for surgery, with the diagnosis of osteoarthritis left hip.  The various methods of treatment have been discussed with the patient and family. After consideration of risks, benefits and other options for treatment, the patient has consented to  Procedure(s): LEFT TOTAL HIP ARTHROPLASTY ANTERIOR APPROACH (Left) as a surgical intervention.  The patient's history has been reviewed, patient examined, no change in status, stable for surgery.  I have reviewed the patient's chart and labs.  Questions were answered to the patient's satisfaction.     Kathryne Hitch

## 2023-05-04 NOTE — Anesthesia Preprocedure Evaluation (Signed)
Anesthesia Evaluation  Patient identified by MRN, date of birth, ID band Patient awake    Reviewed: Allergy & Precautions, NPO status , Patient's Chart, lab work & pertinent test results  Airway Mallampati: II  TM Distance: >3 FB Neck ROM: Full    Dental no notable dental hx. (+) Teeth Intact, Caps, Dental Advisory Given   Pulmonary Current Smoker and Patient abstained from smoking.   Pulmonary exam normal breath sounds clear to auscultation       Cardiovascular + CAD  Normal cardiovascular exam Rhythm:Regular Rate:Normal     Neuro/Psych  PSYCHIATRIC DISORDERS Anxiety     negative neurological ROS     GI/Hepatic negative GI ROS, Neg liver ROS,,,  Endo/Other  negative endocrine ROS    Renal/GU negative Renal ROS  negative genitourinary   Musculoskeletal  (+) Arthritis , Osteoarthritis,  OA left hip Osteopenia   Abdominal   Peds  Hematology  (+) Blood dyscrasia, anemia   Anesthesia Other Findings   Reproductive/Obstetrics                              Anesthesia Physical Anesthesia Plan  ASA: 2  Anesthesia Plan: Spinal   Post-op Pain Management: Dilaudid IV   Induction: Intravenous  PONV Risk Score and Plan: 2 and Treatment may vary due to age or medical condition and Propofol infusion  Airway Management Planned: Natural Airway and Simple Face Mask  Additional Equipment: None  Intra-op Plan:   Post-operative Plan:   Informed Consent: I have reviewed the patients History and Physical, chart, labs and discussed the procedure including the risks, benefits and alternatives for the proposed anesthesia with the patient or authorized representative who has indicated his/her understanding and acceptance.     Dental advisory given  Plan Discussed with: CRNA and Anesthesiologist  Anesthesia Plan Comments:          Anesthesia Quick Evaluation

## 2023-05-04 NOTE — Anesthesia Postprocedure Evaluation (Signed)
Anesthesia Post Note  Patient: Julie Schneider  Procedure(s) Performed: LEFT TOTAL HIP ARTHROPLASTY ANTERIOR APPROACH (Left: Hip)     Patient location during evaluation: PACU Anesthesia Type: Spinal Level of consciousness: oriented and awake and alert Pain management: pain level controlled Vital Signs Assessment: post-procedure vital signs reviewed and stable Respiratory status: spontaneous breathing, respiratory function stable and nonlabored ventilation Cardiovascular status: blood pressure returned to baseline and stable Postop Assessment: no headache, no backache, no apparent nausea or vomiting and spinal receding Anesthetic complications: no   No notable events documented.  Last Vitals:  Vitals:   05/04/23 1130 05/04/23 1145  BP: 94/71 100/72  Pulse: 64 62  Resp: (!) 33 (!) 25  Temp:    SpO2: 94% 100%    Last Pain:  Vitals:   05/04/23 1125  TempSrc:   PainSc: 0-No pain                 [name] A.

## 2023-05-04 NOTE — Transfer of Care (Signed)
Immediate Anesthesia Transfer of Care Note  Patient: Julie Schneider  Procedure(s) Performed: LEFT TOTAL HIP ARTHROPLASTY ANTERIOR APPROACH (Left: Hip)  Patient Location: PACU  Anesthesia Type:Spinal  Level of Consciousness: awake, alert , oriented, and patient cooperative  Airway & Oxygen Therapy: Patient Spontanous Breathing and Patient connected to face mask oxygen  Post-op Assessment: Report given to RN and Post -op Vital signs reviewed and stable  Post vital signs: Reviewed and stable  Last Vitals:  Vitals Value Taken Time  BP 95/69 05/04/23 1124  Temp    Pulse 81 05/04/23 1125  Resp 16 05/04/23 1125  SpO2 97 % 05/04/23 1125  Vitals shown include unvalidated device data.  Last Pain:  Vitals:   05/04/23 0743  TempSrc:   PainSc: 0-No pain         Complications: No notable events documented.

## 2023-05-04 NOTE — Anesthesia Procedure Notes (Signed)
Spinal  Patient location during procedure: OR Start time: 05/04/2023 9:50 AM End time: 05/04/2023 9:53 AM Reason for block: surgical anesthesia Staffing Anesthesiologist: Mal Amabile, MD Performed by: Mal Amabile, MD Authorized by: Mal Amabile, MD   Preanesthetic Checklist Completed: patient identified, IV checked, site marked, risks and benefits discussed, surgical consent, monitors and equipment checked, pre-op evaluation and timeout performed Spinal Block Patient position: sitting Prep: DuraPrep and site prepped and draped Patient monitoring: heart rate, cardiac monitor, continuous pulse ox and blood pressure Approach: midline Location: L3-4 Injection technique: single-shot Needle Needle type: Pencan  Needle gauge: 24 G Needle length: 9 cm Needle insertion depth: 5 cm Assessment Sensory level: T4 Events: CSF return Additional Notes Patient tolerated procedure well. Adequate sensory level.

## 2023-05-05 DIAGNOSIS — Z79899 Other long term (current) drug therapy: Secondary | ICD-10-CM | POA: Diagnosis not present

## 2023-05-05 DIAGNOSIS — F17210 Nicotine dependence, cigarettes, uncomplicated: Secondary | ICD-10-CM | POA: Diagnosis not present

## 2023-05-05 DIAGNOSIS — Z96642 Presence of left artificial hip joint: Secondary | ICD-10-CM | POA: Diagnosis not present

## 2023-05-05 DIAGNOSIS — Z96649 Presence of unspecified artificial hip joint: Secondary | ICD-10-CM | POA: Diagnosis not present

## 2023-05-05 DIAGNOSIS — M1612 Unilateral primary osteoarthritis, left hip: Secondary | ICD-10-CM | POA: Diagnosis not present

## 2023-05-05 LAB — CBC
HCT: 29.2 % — ABNORMAL LOW (ref 36.0–46.0)
Hemoglobin: 9.7 g/dL — ABNORMAL LOW (ref 12.0–15.0)
MCH: 31.3 pg (ref 26.0–34.0)
MCHC: 33.2 g/dL (ref 30.0–36.0)
MCV: 94.2 fL (ref 80.0–100.0)
Platelets: 141 10*3/uL — ABNORMAL LOW (ref 150–400)
RBC: 3.1 MIL/uL — ABNORMAL LOW (ref 3.87–5.11)
RDW: 14.4 % (ref 11.5–15.5)
WBC: 11.5 10*3/uL — ABNORMAL HIGH (ref 4.0–10.5)
nRBC: 0 % (ref 0.0–0.2)

## 2023-05-05 LAB — BASIC METABOLIC PANEL
Anion gap: 7 (ref 5–15)
BUN: 13 mg/dL (ref 8–23)
CO2: 24 mmol/L (ref 22–32)
Calcium: 7.9 mg/dL — ABNORMAL LOW (ref 8.9–10.3)
Chloride: 107 mmol/L (ref 98–111)
Creatinine, Ser: 0.88 mg/dL (ref 0.44–1.00)
GFR, Estimated: >60 mL/min (ref >60)
Glucose, Bld: 108 mg/dL — ABNORMAL HIGH (ref 70–99)
Potassium: 4.2 mmol/L (ref 3.5–5.1)
Sodium: 138 mmol/L (ref 135–145)

## 2023-05-05 MED ORDER — ASPIRIN 81 MG PO CHEW: 81.0000 mg | CHEWABLE_TABLET | Freq: Two times a day (BID) | ORAL | 0 refills | Status: DC

## 2023-05-05 MED ORDER — OXYCODONE HCL 5 MG PO TABS: 5.0000 mg | ORAL_TABLET | Freq: Four times a day (QID) | ORAL | 0 refills | Status: DC | PRN

## 2023-05-05 MED ORDER — METHOCARBAMOL 500 MG PO TABS: 500.0000 mg | ORAL_TABLET | Freq: Four times a day (QID) | ORAL | 1 refills | Status: DC | PRN

## 2023-05-05 NOTE — Discharge Instructions (Signed)

## 2023-05-05 NOTE — Progress Notes (Signed)
Provided discharge education/instructions, all questions and concerns addressed. Pt is not in any distress, discharged home with all of her belongings accompanied by family.

## 2023-05-05 NOTE — Plan of Care (Signed)

## 2023-05-05 NOTE — Discharge Summary (Signed)
Patient ID: Julie Schneider MRN: [id number] DOB/AGE: 66-01-1956 66 y.o.  Admit date: 05/04/2023 Discharge date: 05/05/2023  Admission Diagnoses:  Principal Problem:   Status post total replacement of left hip Active Problems:   Unilateral primary osteoarthritis, left hip   Discharge Diagnoses:  Same  Past Medical History:  Diagnosis Date   Arthritis     Surgeries: Procedure(s): LEFT TOTAL HIP ARTHROPLASTY ANTERIOR APPROACH on 05/04/2023   Consultants:   Discharged Condition: Improved  Hospital Course: Julie Schneider is an 67 y.o. female who was admitted 05/04/2023 for operative treatment ofStatus post total replacement of left hip. Patient has severe unremitting pain that affects sleep, daily activities, and work/hobbies. After pre-op clearance the patient was taken to the operating room on 05/04/2023 and underwent  Procedure(s): LEFT TOTAL HIP ARTHROPLASTY ANTERIOR APPROACH.    Patient was given perioperative antibiotics:  Anti-infectives (From admission, onward)    Start     Dose/Rate Route Frequency Ordered Stop   05/04/23 1600  ceFAZolin (ANCEF) IVPB 1 g/50 mL premix        1 g 100 mL/hr over 30 Minutes Intravenous Every 6 hours 05/04/23 1249 05/05/23 1006   05/04/23 0730  ceFAZolin (ANCEF) IVPB 2g/100 mL premix        2 g 200 mL/hr over 30 Minutes Intravenous On call to O.R. 05/04/23 0725 05/04/23 1016        Patient was given sequential compression devices, early ambulation, and chemoprophylaxis to prevent DVT.  Patient benefited maximally from hospital stay and there were no complications.    Recent vital signs: Patient Vitals for the past 24 hrs:  BP Temp Temp src Pulse Resp SpO2  05/05/23 1146 103/72 99.3 F (37.4 C) Oral 74 17 93 %  05/05/23 0835 108/76 98.4 F (36.9 C) Oral 82 16 92 %  05/05/23 0513 112/67 99.2 F (37.3 C) Oral 76 16 95 %  05/05/23 0126 107/64 98.6 F (37 C) Oral 64 17 96 %  05/04/23 2148 102/67 98.7 F (37.1 C) Oral 62 17 96 %      Recent laboratory studies:  Recent Labs    05/05/23 0402  WBC 11.5*  HGB 9.7*  HCT 29.2*  PLT 141*  NA 138  K 4.2  CL 107  CO2 24  BUN 13  CREATININE 0.88  GLUCOSE 108*  CALCIUM 7.9*     Discharge Medications:   Allergies as of 05/05/2023   No Known Allergies      Medication List     TAKE these medications    aspirin 81 MG chewable tablet Chew 1 tablet (81 mg total) by mouth 2 (two) times daily.   Centrum Silver Chew Chew 1 tablet by mouth daily. 1 tablet orally once a day Notes to patient: Resume home regimen   CITRACAL MAXIMUM PO Take 2 capsules by mouth daily. Notes to patient: Resume home regimen   Cosamin DS 500-400 MG tablet Generic drug: glucosamine-chondroitin Take 1 tablet by mouth daily. Notes to patient: Resume home regimen   meloxicam 15 MG tablet Commonly known as: MOBIC Take 15 mg by mouth daily. Notes to patient: Resume home regimen   methocarbamol 500 MG tablet Commonly known as: ROBAXIN Take 1 tablet (500 mg total) by mouth every 6 (six) hours as needed for muscle spasms. Notes to patient: Last dose given 06/15 05:46am   oxyCODONE 5 MG immediate release tablet Commonly known as: Oxy IR/ROXICODONE Take 1-2 tablets (5-10 mg total) by mouth every 6 (six) hours as  needed for moderate pain (pain score 4-6). Notes to patient: Last dose given 06/15 08:41am   sertraline 50 MG tablet Commonly known as: ZOLOFT Take 50 mg by mouth daily.   Vitamin D 50 MCG (2000 UT) tablet Take 2,000 Units by mouth daily.   zinc gluconate 50 MG tablet Take 50 mg by mouth daily. Notes to patient: Resume home regimen        Diagnostic Studies: DG HIP UNILAT WITH PELVIS 2-3 VIEWS LEFT  Result Date: 05/04/2023 CLINICAL DATA:  Left hip replacement. EXAM: DG HIP (WITH OR WITHOUT PELVIS) 2-3V LEFT COMPARISON:  None Available. FINDINGS: Three fluoroscopic spot views of the pelvis and left hip obtained in the operating room. Fluoroscopic spot views  during hip arthroplasty. Fluoroscopy time 16 seconds. Dose 0.9487 mGy. IMPRESSION: Intraoperative fluoroscopy for left hip arthroplasty. Electronically Signed   By: Narda Rutherford M.D.   On: 05/04/2023 12:05   DG Pelvis Portable  Result Date: 05/04/2023 CLINICAL DATA:  Status post left hip replacement. EXAM: PORTABLE PELVIS 1-2 VIEWS COMPARISON:  None Available. FINDINGS: Left hip arthroplasty in expected alignment. No periprosthetic lucency or fracture. Recent postsurgical change includes air and edema in the soft tissues. Lateral skin staples in place. Prior right hip arthroplasty. IMPRESSION: Left hip arthroplasty without immediate postoperative complication. Electronically Signed   By: Narda Rutherford M.D.   On: 05/04/2023 12:04   DG C-Arm 1-60 Min-No Report  Result Date: 05/04/2023 Fluoroscopy was utilized by the requesting physician.  No radiographic interpretation.   DG C-Arm 1-60 Min-No Report  Result Date: 05/04/2023 Fluoroscopy was utilized by the requesting physician.  No radiographic interpretation.    Disposition: Discharge disposition: 01-Home or Self Care          Follow-up Information     [facility] Follow up.   Why: Iantha Fallen will provide PT in the home after discharge. Contact information: [address] [place] [address] [phone]         Kathryne Hitch, MD Follow up in 2 week(s).   Specialty: Orthopedic Surgery Contact information: [address] [place] [address] [phone]                  Signed: Kathryne Hitch 05/05/2023, 7:41 PM

## 2023-05-05 NOTE — Progress Notes (Signed)
Physical Therapy Treatment Patient Details Name: Julie Schneider MRN: [id number] DOB: 05-12-1956 Today's Date: 05/05/2023   History of Present Illness 67 yo female presents to therapy s/p L THA, anterior approach on 05/04/2023 due to failure of conservative measures. Pt PMH includes but is not limited to: coronary arteriosclerosis, thyroid nodule, tobacco abuse, acetabular fx (2014), and R THA (2015).    PT Comments    Pt very cooperative and progressing well with mobility.  Pt performed therex program with assist, up to ambulate increased distance in hall and negotiated stairs.  Pt eager for dc home this date.   Recommendations for follow up therapy are one component of a multi-disciplinary discharge planning process, led by the attending physician.  Recommendations may be updated based on patient status, additional functional criteria and insurance authorization.  Follow Up Recommendations       Assistance Recommended at Discharge Intermittent Supervision/Assistance  Patient can return home with the following A little help with walking and/or transfers;A little help with bathing/dressing/bathroom;Assistance with cooking/housework;Assist for transportation;Help with stairs or ramp for entrance   Equipment Recommendations  Rolling walker (2 wheels)    Recommendations for Other Services       Precautions / Restrictions Precautions Precautions: Fall Restrictions Weight Bearing Restrictions: No LLE Weight Bearing: Weight bearing as tolerated     Mobility  Bed Mobility Overal bed mobility: Needs Assistance Bed Mobility: Supine to Sit     Supine to sit: Supervision, HOB elevated     General bed mobility comments: min cues    Transfers Overall transfer level: Needs assistance Equipment used: Rolling walker (2 wheels) Transfers: Sit to/from Stand Sit to Stand: Min guard, Supervision           General transfer comment: min cues for proper UE and AD placement     Ambulation/Gait Ambulation/Gait assistance: Min guard, Supervision Gait Distance (Feet): 180 Feet Assistive device: Rolling walker (2 wheels) Gait Pattern/deviations: Step-to pattern, Step-through pattern, Shuffle, Trunk flexed       General Gait Details: cues for posture, position from RW and initial sequence   Stairs Stairs: Yes Stairs assistance: Min guard Stair Management: Two rails, One rail Right, Step to pattern, Forwards, With cane Number of Stairs: 5 General stair comments: 2 steps with bil rails and 3 steps with rail and cane   Wheelchair Mobility    Modified Rankin (Stroke Patients Only)       Balance Overall balance assessment: Needs assistance Sitting-balance support: Feet supported Sitting balance-Leahy Scale: Good     Standing balance support: No upper extremity supported Standing balance-Leahy Scale: Fair                              Cognition Arousal/Alertness: Awake/alert Behavior During Therapy: WFL for tasks assessed/performed Overall Cognitive Status: Within Functional Limits for tasks assessed                                          Exercises Total Joint Exercises Ankle Circles/Pumps: AROM, Both, 20 reps Quad Sets: AROM, Both, 10 reps, Supine Heel Slides: AAROM, Left, 20 reps, Supine Hip ABduction/ADduction: AAROM, Both, 15 reps, Supine    General Comments        Pertinent Vitals/Pain Pain Assessment Pain Assessment: 0-10 Pain Score: 3  Pain Location: L hip Pain Descriptors / Indicators: Aching, Constant, Dull, Operative site  guarding Pain Intervention(s): Limited activity within patient's tolerance, Monitored during session, Premedicated before session, Ice applied    Home Living                          Prior Function            PT Goals (current goals can now be found in the care plan section) Acute Rehab PT Goals Patient Stated Goal: to start a walking program, drink more water  and quit smoking PT Goal Formulation: With patient Time For Goal Achievement: 05/18/23 Potential to Achieve Goals: Good Progress towards PT goals: Progressing toward goals    Frequency    7X/week      PT Plan Current plan remains appropriate    Co-evaluation              AM-PAC PT "6 Clicks" Mobility   Outcome Measure  Help needed turning from your back to your side while in a flat bed without using bedrails?: A Little Help needed moving from lying on your back to sitting on the side of a flat bed without using bedrails?: A Little Help needed moving to and from a bed to a chair (including a wheelchair)?: A Little Help needed standing up from a chair using your arms (e.g., wheelchair or bedside chair)?: A Little Help needed to walk in hospital room?: A Little Help needed climbing 3-5 steps with a railing? : A Little 6 Click Score: 18    End of Session Equipment Utilized During Treatment: Gait belt Activity Tolerance: Patient tolerated treatment well;No increased pain Patient left: in chair;with call bell/phone within reach Nurse Communication: Mobility status PT Visit Diagnosis: Unsteadiness on feet (R26.81);Other abnormalities of gait and mobility (R26.89);Muscle weakness (generalized) (M62.81);Pain Pain - Right/Left: Left Pain - part of body: Hip     Time: 8119-1478 PT Time Calculation (min) (ACUTE ONLY): 25 min  Charges:  $Gait Training: 8-22 mins $Therapeutic Exercise: 8-22 mins                     Mauro Kaufmann PT Acute Rehabilitation Services Pager [phone] Office [phone]    Artie Mcintyre 05/05/2023, 12:15 PM

## 2023-05-05 NOTE — Progress Notes (Signed)
Subjective: 1 Day Post-Op Procedure(s) (LRB): LEFT TOTAL HIP ARTHROPLASTY ANTERIOR APPROACH (Left) Patient reports pain as moderate.    Objective: Vital signs in last 24 hours: Temp:  [97.7 F (36.5 C)-99.2 F (37.3 C)] 98.4 F (36.9 C) (06/15 0835) Pulse Rate:  [62-82] 82 (06/15 0835) Resp:  [14-33] 16 (06/15 0835) BP: (94-112)/(64-76) 108/76 (06/15 0835) SpO2:  [90 %-100 %] 92 % (06/15 0835)  Intake/Output from previous day: 06/14 0701 - 06/15 0700 In: 2730.6 [P.O.:360; I.V.:1820.6; IV Piggyback:300] Out: 1900 [Urine:1550; Blood:350] Intake/Output this shift: No intake/output data recorded.  Recent Labs    05/05/23 0402  HGB 9.7*   Recent Labs    05/05/23 0402  WBC 11.5*  RBC 3.10*  HCT 29.2*  PLT 141*   Recent Labs    05/05/23 0402  NA 138  K 4.2  CL 107  CO2 24  BUN 13  CREATININE 0.88  GLUCOSE 108*  CALCIUM 7.9*   No results for input(s): "LABPT", "INR" in the last 72 hours.  Sensation intact distally Intact pulses distally Dorsiflexion/Plantar flexion intact Incision: dressing C/D/I   Assessment/Plan: 1 Day Post-Op Procedure(s) (LRB): LEFT TOTAL HIP ARTHROPLASTY ANTERIOR APPROACH (Left) Up with therapy Discharge home with home health this afternoon.      Kathryne Hitch 05/05/2023, 9:37 AM

## 2023-05-07 ENCOUNTER — Encounter (HOSPITAL_COMMUNITY): Payer: Self-pay | Admitting: Orthopaedic Surgery

## 2023-05-08 ENCOUNTER — Telehealth: Payer: Self-pay | Admitting: Orthopaedic Surgery

## 2023-05-08 DIAGNOSIS — M1612 Unilateral primary osteoarthritis, left hip: Secondary | ICD-10-CM | POA: Diagnosis not present

## 2023-05-08 DIAGNOSIS — Z471 Aftercare following joint replacement surgery: Secondary | ICD-10-CM | POA: Diagnosis not present

## 2023-05-08 DIAGNOSIS — Z7982 Long term (current) use of aspirin: Secondary | ICD-10-CM | POA: Diagnosis not present

## 2023-05-08 NOTE — Telephone Encounter (Signed)
Mat called. He is the PT working with patient in home he would like verbal orders for PT 3x wk 2wk, 2x 1wk. His cb# [phone]

## 2023-05-08 NOTE — Telephone Encounter (Signed)
Called and gave verbal ok

## 2023-05-09 DIAGNOSIS — Z471 Aftercare following joint replacement surgery: Secondary | ICD-10-CM | POA: Diagnosis not present

## 2023-05-09 DIAGNOSIS — Z7982 Long term (current) use of aspirin: Secondary | ICD-10-CM | POA: Diagnosis not present

## 2023-05-09 DIAGNOSIS — M1612 Unilateral primary osteoarthritis, left hip: Secondary | ICD-10-CM | POA: Diagnosis not present

## 2023-05-11 ENCOUNTER — Telehealth: Payer: Self-pay | Admitting: *Deleted

## 2023-05-11 DIAGNOSIS — M1612 Unilateral primary osteoarthritis, left hip: Secondary | ICD-10-CM | POA: Diagnosis not present

## 2023-05-11 DIAGNOSIS — Z471 Aftercare following joint replacement surgery: Secondary | ICD-10-CM | POA: Diagnosis not present

## 2023-05-11 DIAGNOSIS — Z7982 Long term (current) use of aspirin: Secondary | ICD-10-CM | POA: Diagnosis not present

## 2023-05-11 NOTE — Telephone Encounter (Signed)
Ortho bundle D/C (Late entry) and 7 days completed.

## 2023-05-14 DIAGNOSIS — M1612 Unilateral primary osteoarthritis, left hip: Secondary | ICD-10-CM | POA: Diagnosis not present

## 2023-05-14 DIAGNOSIS — Z471 Aftercare following joint replacement surgery: Secondary | ICD-10-CM | POA: Diagnosis not present

## 2023-05-14 DIAGNOSIS — Z7982 Long term (current) use of aspirin: Secondary | ICD-10-CM | POA: Diagnosis not present

## 2023-05-16 DIAGNOSIS — Z7982 Long term (current) use of aspirin: Secondary | ICD-10-CM | POA: Diagnosis not present

## 2023-05-16 DIAGNOSIS — Z471 Aftercare following joint replacement surgery: Secondary | ICD-10-CM | POA: Diagnosis not present

## 2023-05-16 DIAGNOSIS — M1612 Unilateral primary osteoarthritis, left hip: Secondary | ICD-10-CM | POA: Diagnosis not present

## 2023-05-17 ENCOUNTER — Ambulatory Visit (INDEPENDENT_AMBULATORY_CARE_PROVIDER_SITE_OTHER): Payer: Medicare Other | Admitting: Physician Assistant

## 2023-05-17 ENCOUNTER — Encounter: Payer: Self-pay | Admitting: Physician Assistant

## 2023-05-17 DIAGNOSIS — Z96642 Presence of left artificial hip joint: Secondary | ICD-10-CM

## 2023-05-17 NOTE — Progress Notes (Signed)
HPI: Julie Schneider returns today status post left total hip arthroplasty 05/04/2023.  She is overall doing well.  She states she has no pain just some discomfort at times.  Taking no pain medications.  She is on aspirin twice daily for DVT prophylaxis.  She is ambulating without any assistive device.    Review of systems: No fevers chills  Physical exam: General well-developed well-nourished female who ambulates about the room without any assistive device and a nonantalgic gait. Left hip: Good range of motion without pain calf supple nontender.  Surgical incisions healing well no signs of infection wound dehiscence.  Impression: Status post left total hip arthroplasty 05/04/2023  Plan: Julie Schneider were harvested Steri-Strips applied.  She will work on scar tissue mobilization.  Continue work on gait balance and overall strength.  See her back in 1 month sooner if there is any questions or concerns

## 2023-05-18 ENCOUNTER — Telehealth: Payer: Self-pay | Admitting: *Deleted

## 2023-05-18 DIAGNOSIS — Z7982 Long term (current) use of aspirin: Secondary | ICD-10-CM | POA: Diagnosis not present

## 2023-05-18 DIAGNOSIS — Z471 Aftercare following joint replacement surgery: Secondary | ICD-10-CM | POA: Diagnosis not present

## 2023-05-18 DIAGNOSIS — M1612 Unilateral primary osteoarthritis, left hip: Secondary | ICD-10-CM | POA: Diagnosis not present

## 2023-05-18 NOTE — Telephone Encounter (Signed)
Ortho bundle 14 day office visit completed. 

## 2023-05-21 DIAGNOSIS — M1612 Unilateral primary osteoarthritis, left hip: Secondary | ICD-10-CM | POA: Diagnosis not present

## 2023-05-21 DIAGNOSIS — Z471 Aftercare following joint replacement surgery: Secondary | ICD-10-CM | POA: Diagnosis not present

## 2023-05-21 DIAGNOSIS — Z7982 Long term (current) use of aspirin: Secondary | ICD-10-CM | POA: Diagnosis not present

## 2023-05-25 DIAGNOSIS — M1612 Unilateral primary osteoarthritis, left hip: Secondary | ICD-10-CM | POA: Diagnosis not present

## 2023-05-25 DIAGNOSIS — Z471 Aftercare following joint replacement surgery: Secondary | ICD-10-CM | POA: Diagnosis not present

## 2023-05-25 DIAGNOSIS — Z7982 Long term (current) use of aspirin: Secondary | ICD-10-CM | POA: Diagnosis not present

## 2023-05-29 ENCOUNTER — Ambulatory Visit: Payer: Medicare Other | Admitting: Podiatry

## 2023-05-29 DIAGNOSIS — B351 Tinea unguium: Secondary | ICD-10-CM

## 2023-05-29 DIAGNOSIS — M79676 Pain in unspecified toe(s): Secondary | ICD-10-CM | POA: Diagnosis not present

## 2023-05-29 DIAGNOSIS — L608 Other nail disorders: Secondary | ICD-10-CM

## 2023-05-29 NOTE — Progress Notes (Signed)
  Subjective:  Patient ID: Julie Schneider, female    DOB: [birth year],  MRN: [id number]   67 y.o. female presents with the above complaint. History confirmed with patient. Patient presenting with pain related to dystrophic thickened elongated nails. Patient is unable to trim own nails related to nail dystrophy and/or mobility issues. Patient does not have a history of T2DM.  Objective:  Physical Exam: warm, good capillary refill nail exam onychomycosis of the toenails, onycholysis, and dystrophic nails DP pulses palpable, PT pulses palpable, and protective sensation intact Left Foot:  Pain with palpation of nails due to elongation and dystrophic growth.  Right Foot: Pain with palpation of nails due to elongation and dystrophic growth.   Assessment:   1. Pain due to onychomycosis of nail   2. Pincer nail deformity     Plan:  Patient was evaluated and treated and all questions answered.   #Onychomycosis with pain  -Nails palliatively debrided as below. -Educated on self-care  Procedure: Nail Debridement Rationale: Pain Type of Debridement: manual, sharp debridement. Instrumentation: Nail nipper, rotary burr. Number of Nails: 10  Return in about 3 months (around 08/29/2023) for RFC.         Corinna Gab, DPM [facility] / [facility]

## 2023-06-14 ENCOUNTER — Ambulatory Visit (INDEPENDENT_AMBULATORY_CARE_PROVIDER_SITE_OTHER): Payer: Medicare Other | Admitting: Physician Assistant

## 2023-06-14 ENCOUNTER — Encounter: Payer: Self-pay | Admitting: Physician Assistant

## 2023-06-14 VITALS — Ht 60.0 in | Wt 99.0 lb

## 2023-06-14 DIAGNOSIS — Z96642 Presence of left artificial hip joint: Secondary | ICD-10-CM

## 2023-06-14 NOTE — Progress Notes (Signed)
HPI: Mrs. Hoyt comes in today status post left total hip arthroplasty 05/04/2023.  She is overall doing the states she has great range of motion and strength.  No pain in the hip.  No fevers chills.   General: No acute distress. Ambulates without any assistive device. Bilateral hips: Excellent range of motion of both hips without pain.  Left calf supple nontender.  Dorsiflexion plantarflexion intact bilaterally  Impression: Status post left total hip arthroplasty  Plan: She will follow-up with [place] at 6 months postop and obtain an AP pelvis and lateral view of her left hip at that time. Will see her back sooner if there is any questions concerns.  Questions were encouraged and answered by Dr. Magnus Ivan and myself.

## 2023-06-18 ENCOUNTER — Telehealth: Payer: Self-pay | Admitting: *Deleted

## 2023-06-18 NOTE — Telephone Encounter (Signed)
In office Ortho bundle meeting completed.

## 2023-07-10 DIAGNOSIS — Z1231 Encounter for screening mammogram for malignant neoplasm of breast: Secondary | ICD-10-CM | POA: Diagnosis not present

## 2023-07-12 ENCOUNTER — Ambulatory Visit
Admission: RE | Admit: 2023-07-12 | Discharge: 2023-07-12 | Disposition: A | Discharge status: Home / Self Care | Payer: Medicare Other | Source: Ambulatory Visit | Attending: Family Medicine | Admitting: Family Medicine

## 2023-07-12 DIAGNOSIS — F17210 Nicotine dependence, cigarettes, uncomplicated: Secondary | ICD-10-CM

## 2023-07-12 DIAGNOSIS — Z122 Encounter for screening for malignant neoplasm of respiratory organs: Secondary | ICD-10-CM

## 2023-07-12 DIAGNOSIS — Z87891 Personal history of nicotine dependence: Secondary | ICD-10-CM

## 2023-07-20 ENCOUNTER — Other Ambulatory Visit: Payer: Self-pay | Admitting: Acute Care

## 2023-07-20 DIAGNOSIS — F17210 Nicotine dependence, cigarettes, uncomplicated: Secondary | ICD-10-CM

## 2023-07-20 DIAGNOSIS — Z122 Encounter for screening for malignant neoplasm of respiratory organs: Secondary | ICD-10-CM

## 2023-07-20 DIAGNOSIS — Z87891 Personal history of nicotine dependence: Secondary | ICD-10-CM

## 2023-08-23 DIAGNOSIS — H02834 Dermatochalasis of left upper eyelid: Secondary | ICD-10-CM | POA: Diagnosis not present

## 2023-08-23 DIAGNOSIS — H02831 Dermatochalasis of right upper eyelid: Secondary | ICD-10-CM | POA: Diagnosis not present

## 2023-08-23 DIAGNOSIS — H5203 Hypermetropia, bilateral: Secondary | ICD-10-CM | POA: Diagnosis not present

## 2023-08-28 ENCOUNTER — Ambulatory Visit: Payer: Medicare Other | Admitting: Podiatry

## 2023-09-03 ENCOUNTER — Ambulatory Visit: Payer: Medicare Other | Admitting: Podiatry

## 2023-09-04 ENCOUNTER — Ambulatory Visit: Payer: Medicare Other | Admitting: Podiatry

## 2023-09-10 ENCOUNTER — Ambulatory Visit (INDEPENDENT_AMBULATORY_CARE_PROVIDER_SITE_OTHER): Payer: Medicare Other | Admitting: Podiatry

## 2023-09-10 DIAGNOSIS — Z91199 Patient's noncompliance with other medical treatment and regimen due to unspecified reason: Secondary | ICD-10-CM

## 2023-09-10 NOTE — Progress Notes (Signed)
 Patient absent for apointment

## 2023-09-14 ENCOUNTER — Telehealth: Payer: Self-pay | Admitting: *Deleted

## 2023-09-14 DIAGNOSIS — L578 Other skin changes due to chronic exposure to nonionizing radiation: Secondary | ICD-10-CM | POA: Diagnosis not present

## 2023-09-14 DIAGNOSIS — L570 Actinic keratosis: Secondary | ICD-10-CM | POA: Diagnosis not present

## 2023-09-14 DIAGNOSIS — D225 Melanocytic nevi of trunk: Secondary | ICD-10-CM | POA: Diagnosis not present

## 2023-09-14 DIAGNOSIS — L821 Other seborrheic keratosis: Secondary | ICD-10-CM | POA: Diagnosis not present

## 2023-09-14 DIAGNOSIS — L814 Other melanin hyperpigmentation: Secondary | ICD-10-CM | POA: Diagnosis not present

## 2023-09-14 NOTE — Telephone Encounter (Signed)
Ortho bundle call completed. 

## 2023-09-18 ENCOUNTER — Ambulatory Visit (INDEPENDENT_AMBULATORY_CARE_PROVIDER_SITE_OTHER): Payer: Medicare Other | Admitting: Podiatry

## 2023-09-18 ENCOUNTER — Encounter: Payer: Self-pay | Admitting: Podiatry

## 2023-09-18 DIAGNOSIS — B351 Tinea unguium: Secondary | ICD-10-CM | POA: Diagnosis not present

## 2023-09-18 DIAGNOSIS — M79609 Pain in unspecified limb: Secondary | ICD-10-CM | POA: Diagnosis not present

## 2023-09-18 NOTE — Progress Notes (Signed)
  Subjective:  Patient ID: Julie Schneider, female    DOB: Jul 18, 1956,  MRN: [id number]   67 y.o. female presents with the above complaint. History confirmed with patient. Patient presenting with pain related to dystrophic thickened elongated nails. Patient is unable to trim own nails related to nail dystrophy and/or mobility issues. Patient does not have a history of T2DM.  Objective:  Physical Exam: warm, good capillary refill nail exam onychomycosis of the toenails, onycholysis, and dystrophic nails DP pulses palpable, PT pulses palpable, and protective sensation intact Left Foot:  Pain with palpation of nails due to elongation and dystrophic growth.  Right Foot: Pain with palpation of nails due to elongation and dystrophic growth.   Assessment:   1. Pain due to onychomycosis of nail      Plan:  Patient was evaluated and treated and all questions answered.   #Onychomycosis with pain  -Nails palliatively debrided as below. -Educated on self-care  Procedure: Nail Debridement Rationale: Pain Type of Debridement: manual, sharp debridement. Instrumentation: Nail nipper, rotary burr. Number of Nails: 10  Return in about 3 months (around 12/19/2023) for RFC.         Corinna Gab, DPM [facility] / [facility]

## 2023-10-03 DIAGNOSIS — M8580 Other specified disorders of bone density and structure, unspecified site: Secondary | ICD-10-CM | POA: Diagnosis not present

## 2023-10-03 DIAGNOSIS — D696 Thrombocytopenia, unspecified: Secondary | ICD-10-CM | POA: Diagnosis not present

## 2023-10-03 DIAGNOSIS — I700 Atherosclerosis of aorta: Secondary | ICD-10-CM | POA: Diagnosis not present

## 2023-10-03 DIAGNOSIS — Z0000 Encounter for general adult medical examination without abnormal findings: Secondary | ICD-10-CM | POA: Diagnosis not present

## 2023-10-03 DIAGNOSIS — I2510 Atherosclerotic heart disease of native coronary artery without angina pectoris: Secondary | ICD-10-CM | POA: Diagnosis not present

## 2023-10-03 DIAGNOSIS — J449 Chronic obstructive pulmonary disease, unspecified: Secondary | ICD-10-CM | POA: Diagnosis not present

## 2023-10-03 DIAGNOSIS — Z23 Encounter for immunization: Secondary | ICD-10-CM | POA: Diagnosis not present

## 2023-10-03 DIAGNOSIS — Z131 Encounter for screening for diabetes mellitus: Secondary | ICD-10-CM | POA: Diagnosis not present

## 2023-10-03 DIAGNOSIS — E041 Nontoxic single thyroid nodule: Secondary | ICD-10-CM | POA: Diagnosis not present

## 2023-10-03 DIAGNOSIS — Z720 Tobacco use: Secondary | ICD-10-CM | POA: Diagnosis not present

## 2023-10-03 DIAGNOSIS — M159 Polyosteoarthritis, unspecified: Secondary | ICD-10-CM | POA: Diagnosis not present

## 2023-11-12 DIAGNOSIS — M8588 Other specified disorders of bone density and structure, other site: Secondary | ICD-10-CM | POA: Diagnosis not present

## 2023-11-12 DIAGNOSIS — M810 Age-related osteoporosis without current pathological fracture: Secondary | ICD-10-CM | POA: Diagnosis not present

## 2023-11-17 DIAGNOSIS — Z1212 Encounter for screening for malignant neoplasm of rectum: Secondary | ICD-10-CM | POA: Diagnosis not present

## 2023-11-17 DIAGNOSIS — Z1211 Encounter for screening for malignant neoplasm of colon: Secondary | ICD-10-CM | POA: Diagnosis not present

## 2023-11-22 LAB — EXTERNAL GENERIC LAB PROCEDURE: COLOGUARD: NEGATIVE

## 2023-11-22 LAB — COLOGUARD: COLOGUARD: NEGATIVE

## 2023-12-18 ENCOUNTER — Encounter: Payer: Self-pay | Admitting: Podiatry

## 2023-12-18 ENCOUNTER — Ambulatory Visit: Payer: Medicare Other | Admitting: Podiatry

## 2023-12-18 DIAGNOSIS — B351 Tinea unguium: Secondary | ICD-10-CM

## 2023-12-18 DIAGNOSIS — M79609 Pain in unspecified limb: Secondary | ICD-10-CM

## 2023-12-18 NOTE — Progress Notes (Signed)
  Subjective:  Patient ID: Julie Schneider, female    DOB: 1956/11/15,  MRN: [id number]   68 y.o. female presents with the above complaint. History confirmed with patient. Patient presenting with pain related to dystrophic thickened elongated nails. Patient is unable to trim own nails related to nail dystrophy and/or mobility issues. Patient does not have a history of T2DM.  She does complain of pincer nails to the bilateral great toenails that these can be very aggravating for her.  Objective:  Physical Exam: warm, good capillary refill nail exam onychomycosis of the toenails, onycholysis, and dystrophic nails DP pulses palpable, PT pulses palpable, and protective sensation intact Left Foot:  Pain with palpation of nails due to elongation and dystrophic growth.  Right Foot: Pain with palpation of nails due to elongation and dystrophic growth.   Assessment:   1. Pain due to onychomycosis of nail      Plan:  Patient was evaluated and treated and all questions answered.   #Onychomycosis with pain  -Nails palliatively debrided as below. -Educated on self-care -Regarding the pincer nail deformity the great toes, did discuss possible removal of the affected nails versus removal of the borders.  She may consider this going forward.  Electing to proceed with nail debridement for now.  Procedure: Nail Debridement Rationale: Pain Type of Debridement: manual, sharp debridement. Instrumentation: Nail nipper, rotary burr. Number of Nails: 10  Return in about 3 months (around 03/17/2024) for Routine Foot Care.         Bronwen Betters, DPM [facility] / [facility]

## 2023-12-20 ENCOUNTER — Ambulatory Visit: Payer: Medicare Other | Admitting: Physician Assistant

## 2023-12-31 ENCOUNTER — Other Ambulatory Visit (INDEPENDENT_AMBULATORY_CARE_PROVIDER_SITE_OTHER): Payer: Medicare Other

## 2023-12-31 ENCOUNTER — Ambulatory Visit: Payer: Medicare Other | Admitting: Physician Assistant

## 2023-12-31 ENCOUNTER — Encounter: Payer: Self-pay | Admitting: Physician Assistant

## 2023-12-31 DIAGNOSIS — Z96642 Presence of left artificial hip joint: Secondary | ICD-10-CM

## 2023-12-31 NOTE — Progress Notes (Signed)
 HPI: Julie Schneider returns today status post left total hip arthroplasty 05/04/2023.  She states she is doing well.  Has no complaints.  Review of systems: See HPI otherwise negative.  Physical exam: General Well-developed well-nourished female no acute distress.  Affect appropriate.  Ambulates without any assistive device and a nonantalgic gait. Psych: Alert and oriented x 3 Bilateral hips: Good range of motion without pain.  Radiographs: AP pelvis lateral view left hip: Status post bilateral total hip arthroplasties well-seated components.  No acute fractures acute findings.  Both hips well located.  Impression: Status post left total hip arthroplasty 05/04/2023  Plan: See her back in 1 year postop with AP pelvis at that point in time.  Questions were encouraged and answered.  She will follow-up sooner if there is any questions or concerns.

## 2024-03-18 ENCOUNTER — Encounter: Payer: Self-pay | Admitting: Podiatry

## 2024-03-18 ENCOUNTER — Ambulatory Visit: Payer: Medicare Other | Admitting: Podiatry

## 2024-03-18 DIAGNOSIS — L608 Other nail disorders: Secondary | ICD-10-CM

## 2024-03-18 DIAGNOSIS — B351 Tinea unguium: Secondary | ICD-10-CM | POA: Diagnosis not present

## 2024-03-18 DIAGNOSIS — M79609 Pain in unspecified limb: Secondary | ICD-10-CM

## 2024-03-18 NOTE — Progress Notes (Signed)
  Subjective:  Patient ID: Julie Schneider, female    DOB: 1955-12-06,  MRN: [id number]   68 y.o. female presents with the above complaint. History confirmed with patient. Patient presenting with pain related to dystrophic thickened elongated nails. Patient is unable to trim own nails related to nail dystrophy and/or mobility issues. Patient does not have a history of T2DM.  She does complain of pincer nails to the bilateral great toenails that these can be very aggravating for her.  Objective:  Physical Exam: warm, good capillary refill nail exam onychomycosis of the toenails, onycholysis, and dystrophic nails DP pulses palpable, PT pulses palpable, and protective sensation intact Left Foot:  Pain with palpation of nails due to elongation and dystrophic growth.  Right Foot: Pain with palpation of nails due to elongation and dystrophic growth.   Assessment:   1. Pain due to onychomycosis of nail   2. Pincer nail deformity      Plan:  Patient was evaluated and treated and all questions answered.   #Onychomycosis with pain  -Nails palliatively debrided as below. -Educated on self-care -Regarding the pincer nail deformity the great toes, did discuss possible removal of the affected nails versus removal of the borders.  She may consider this going forward.  Electing to proceed with nail debridement for now.  Procedure: Nail Debridement Rationale: Pain Type of Debridement: manual, sharp debridement. Instrumentation: Nail nipper, rotary burr. Number of Nails: 10  Return in about 3 months (around 06/17/2024) for Routine Foot Care.         Eve Hinders, DPM [facility] / [facility]

## 2024-04-04 DIAGNOSIS — R7301 Impaired fasting glucose: Secondary | ICD-10-CM | POA: Diagnosis not present

## 2024-04-04 DIAGNOSIS — I2510 Atherosclerotic heart disease of native coronary artery without angina pectoris: Secondary | ICD-10-CM | POA: Diagnosis not present

## 2024-05-15 ENCOUNTER — Other Ambulatory Visit (INDEPENDENT_AMBULATORY_CARE_PROVIDER_SITE_OTHER): Payer: Self-pay

## 2024-05-15 ENCOUNTER — Ambulatory Visit: Payer: Medicare Other | Admitting: Physician Assistant

## 2024-05-15 ENCOUNTER — Encounter: Payer: Self-pay | Admitting: Physician Assistant

## 2024-05-15 DIAGNOSIS — Z96642 Presence of left artificial hip joint: Secondary | ICD-10-CM

## 2024-05-15 NOTE — Progress Notes (Signed)
 HPI: Julie Schneider comes in today status post left total hip arthroplasty 05/04/2023.  She states her hip is doing great.  She had a right total hip replacement from posterior approach and states that the anterior approach was much easier to get over.  She has no complaints.  Denies any pain in the left hip.  She is back to normal activities.   Review of systems: See HPI otherwise  Physical exam: General Well-developed well-nourished female in no acute distress.  Affect appropriate.  Ambulates without any assistive device.  Left hip: Excellent range of motion without pain calf supple nontender.  She is able to cross her legs.  Radiographs: AP pelvis: Status post bilateral total hip arthroplasties well-seated components.  Good bony ingrowth.  No acute fractures or acute findings.  Impression: Status post left total hip arthroplasty  Plan: She will follow-up with us  as needed.  Questions were encouraged and answered.

## 2024-06-17 ENCOUNTER — Encounter: Payer: Self-pay | Admitting: Podiatry

## 2024-06-17 ENCOUNTER — Ambulatory Visit: Admitting: Podiatry

## 2024-06-17 DIAGNOSIS — M79609 Pain in unspecified limb: Secondary | ICD-10-CM | POA: Diagnosis not present

## 2024-06-17 DIAGNOSIS — B351 Tinea unguium: Secondary | ICD-10-CM

## 2024-06-17 NOTE — Progress Notes (Unsigned)
  Subjective:  Patient ID: Julie Schneider, female    DOB: 1956/03/24,  MRN: [id number]   68 y.o. female presents with the above complaint. History confirmed with patient. Patient presenting with pain related to dystrophic thickened elongated nails. Patient is unable to trim own nails related to nail dystrophy and/or mobility issues. Patient does not have a history of T2DM.  Does have some pincer nail deformity bilateral first toes that do bother her.  Objective:  Physical Exam: warm, good capillary refill nail exam onychomycosis of the toenails, onycholysis, and dystrophic nails DP pulses palpable, PT pulses palpable, and protective sensation intact Left Foot:  Pain with palpation of nails due to elongation and dystrophic growth.  Right Foot: Pain with palpation of nails due to elongation and dystrophic growth.   Assessment:   1. Pain due to onychomycosis of nail      Plan:  Patient was evaluated and treated and all questions answered.   #Onychomycosis with pain  -Nails palliatively debrided as below. -Educated on self-care - Relief noted to the painful dystrophic toenails.  Procedure: Nail Debridement Rationale: Pain Type of Debridement: manual, sharp debridement. Instrumentation: Nail nipper, rotary burr. Number of Nails: 10  Return in about 3 months (around 09/17/2024) for Routine Foot Care.         Ethan Saddler, DPM [facility] / [facility]

## 2024-07-14 ENCOUNTER — Ambulatory Visit (HOSPITAL_BASED_OUTPATIENT_CLINIC_OR_DEPARTMENT_OTHER)
Admission: RE | Admit: 2024-07-14 | Discharge: 2024-07-14 | Disposition: A | Discharge status: Home / Self Care | Source: Ambulatory Visit | Attending: Family Medicine | Admitting: Radiology

## 2024-07-14 DIAGNOSIS — Z122 Encounter for screening for malignant neoplasm of respiratory organs: Secondary | ICD-10-CM | POA: Diagnosis not present

## 2024-07-14 DIAGNOSIS — F17210 Nicotine dependence, cigarettes, uncomplicated: Secondary | ICD-10-CM | POA: Diagnosis not present

## 2024-07-14 DIAGNOSIS — Z87891 Personal history of nicotine dependence: Secondary | ICD-10-CM

## 2024-07-15 DIAGNOSIS — Z1231 Encounter for screening mammogram for malignant neoplasm of breast: Secondary | ICD-10-CM | POA: Diagnosis not present

## 2024-08-06 ENCOUNTER — Other Ambulatory Visit: Payer: Self-pay | Admitting: Acute Care

## 2024-08-06 DIAGNOSIS — Z122 Encounter for screening for malignant neoplasm of respiratory organs: Secondary | ICD-10-CM

## 2024-08-06 DIAGNOSIS — F17210 Nicotine dependence, cigarettes, uncomplicated: Secondary | ICD-10-CM

## 2024-08-06 DIAGNOSIS — Z87891 Personal history of nicotine dependence: Secondary | ICD-10-CM

## 2024-08-28 DIAGNOSIS — D23121 Other benign neoplasm of skin of left upper eyelid, including canthus: Secondary | ICD-10-CM | POA: Diagnosis not present

## 2024-08-28 DIAGNOSIS — H524 Presbyopia: Secondary | ICD-10-CM | POA: Diagnosis not present

## 2024-08-28 DIAGNOSIS — D23111 Other benign neoplasm of skin of right upper eyelid, including canthus: Secondary | ICD-10-CM | POA: Diagnosis not present

## 2024-08-28 DIAGNOSIS — H5203 Hypermetropia, bilateral: Secondary | ICD-10-CM | POA: Diagnosis not present

## 2024-09-16 ENCOUNTER — Ambulatory Visit: Admitting: Podiatry

## 2024-09-16 ENCOUNTER — Encounter: Payer: Self-pay | Admitting: Podiatry

## 2024-09-16 DIAGNOSIS — B351 Tinea unguium: Secondary | ICD-10-CM | POA: Diagnosis not present

## 2024-09-16 DIAGNOSIS — M79609 Pain in unspecified limb: Secondary | ICD-10-CM | POA: Diagnosis not present

## 2024-09-16 NOTE — Progress Notes (Signed)
  Subjective:  Patient ID: Julie Schneider, female    DOB: [birth year],  MRN: [id number]   68 y.o. female presents with the above complaint. History confirmed with patient. Patient presenting with pain related to dystrophic thickened elongated nails. Patient is unable to trim own nails related to nail dystrophy and/or mobility issues. Patient does not have a history of T2DM.  Does have some pincer nail deformity bilateral first toes that do bother her.  Objective:  Physical Exam: warm, good capillary refill nail exam onychomycosis of the toenails, onycholysis, and dystrophic nails DP pulses palpable, PT pulses palpable, and protective sensation intact Left Foot:  Pain with palpation of nails due to elongation and dystrophic growth.  Right Foot: Pain with palpation of nails due to elongation and dystrophic growth.   Assessment:   1. Pain due to onychomycosis of nail      Plan:  Patient was evaluated and treated and all questions answered.   #Onychomycosis with pain  -Nails palliatively debrided as below. -Educated on self-care  Procedure: Nail Debridement Rationale: Pain Type of Debridement: manual, sharp debridement. Instrumentation: Nail nipper, rotary burr. Number of Nails: 10   Return in about 3 months (around 12/17/2024) for Routine Foot Care.         Ethan Saddler, DPM [facility] / [facility]

## 2024-09-22 ENCOUNTER — Encounter: Payer: Self-pay | Admitting: Radiology

## 2024-10-22 ENCOUNTER — Encounter (HOSPITAL_BASED_OUTPATIENT_CLINIC_OR_DEPARTMENT_OTHER): Payer: Self-pay

## 2024-10-22 ENCOUNTER — Other Ambulatory Visit (HOSPITAL_BASED_OUTPATIENT_CLINIC_OR_DEPARTMENT_OTHER): Payer: Self-pay

## 2024-10-22 ENCOUNTER — Ambulatory Visit (HOSPITAL_BASED_OUTPATIENT_CLINIC_OR_DEPARTMENT_OTHER): Admit: 2024-10-22 | Discharge: 2024-10-22 | Disposition: A | Admitting: Radiology

## 2024-10-22 ENCOUNTER — Ambulatory Visit (HOSPITAL_BASED_OUTPATIENT_CLINIC_OR_DEPARTMENT_OTHER): Admission: EM | Admit: 2024-10-22 | Discharge: 2024-10-22 | Disposition: A | Discharge status: Home / Self Care

## 2024-10-22 DIAGNOSIS — W010XXA Fall on same level from slipping, tripping and stumbling without subsequent striking against object, initial encounter: Secondary | ICD-10-CM

## 2024-10-22 DIAGNOSIS — M25552 Pain in left hip: Secondary | ICD-10-CM

## 2024-10-22 DIAGNOSIS — M533 Sacrococcygeal disorders, not elsewhere classified: Secondary | ICD-10-CM

## 2024-10-22 DIAGNOSIS — M7918 Myalgia, other site: Secondary | ICD-10-CM

## 2024-10-22 DIAGNOSIS — Z96643 Presence of artificial hip joint, bilateral: Secondary | ICD-10-CM | POA: Diagnosis not present

## 2024-10-22 DIAGNOSIS — Z8781 Personal history of (healed) traumatic fracture: Secondary | ICD-10-CM

## 2024-10-22 MED ORDER — TRAMADOL HCL 50 MG PO TABS
50.0000 mg | ORAL_TABLET | Freq: Four times a day (QID) | ORAL | 0 refills | Status: AC | PRN
  Filled 2024-10-22: qty 15, 4d supply, fill #0

## 2024-10-22 NOTE — ED Provider Notes (Signed)
 [facility]    CSN: [id number] Arrival date & time: 10/22/24  1045      History   Chief Complaint Chief Complaint  Patient presents with   Hip Pain    HPI Julie Schneider is a 68 y.o. female.   68 year old female who slipped and tripped over a bucket on approximately 10/01/2024 or earlier.  She landed on her tailbone on concrete.  She has had tailbone pain, left buttock pain, left hip pain ever since the fall.  She has previously had a pelvic bone fracture and both hips have been replaced.  The last hip replacement was on the left and it was 18 months ago.  She denies any swelling or bruising. She has taken tylenol  in the beginning with no relief so she has not taken any more pain meds. She is on meloxicam for arthritis but has not noticed any changes when she takes it.      Hip Pain Pertinent negatives include no chest pain and no abdominal pain.    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      Home Medications    Prior to Admission medications   Medication Sig Start Date End Date Taking? Authorizing Provider  traMADol (ULTRAM) 50 MG tablet Take 1 tablet (50 mg total) by mouth every 6 (six) hours as needed. 10/22/24  Yes Ival Domino, FNP  Varenicline Tartrate, Starter, 0.5 MG X 11 & 1 MG X 42 TBPK Take by mouth. 10/22/24  Yes [provider]  Calcium Citrate-Vitamin D  (CITRACAL MAXIMUM PO) Take 2 capsules by mouth daily.    [provider]  Cholecalciferol  (VITAMIN D ) 50 MCG (2000 UT) tablet Take 2,000 Units by mouth daily.    [provider]  glucosamine-chondroitin (COSAMIN DS) 500-400 MG tablet Take 1 tablet by mouth daily.    [provider]  ibandronate (BONIVA) 150 MG tablet TAKE 1 TABLET BY MOUTH ONCE A MONTH; Duration: 90 01/19/15   [provider]  meloxicam (MOBIC) 15 MG tablet Take 15 mg by mouth daily.    [provider]  Multiple Vitamins-Minerals (CENTRUM SILVER) CHEW Chew 1 tablet by mouth daily. 1 tablet orally once a day    [provider]  sertraline  (ZOLOFT ) 50 MG tablet Take 50 mg by mouth daily.    [provider]  zinc  gluconate 50 MG tablet Take 50 mg by mouth daily.  [provider]    Family History History reviewed. No pertinent family history.  Social History Social History   Tobacco Use   Smoking status: Some Days    Types: Cigarettes   Smokeless tobacco: Never  Vaping Use   Vaping status: Never Used  Substance Use Topics   Alcohol use: Never   Drug use: Never     Allergies   Diclofenac   Review of Systems Review of Systems  Constitutional:  Negative for fever.  Respiratory:  Negative for cough.   Cardiovascular:  Negative for chest pain.  Gastrointestinal:  Negative for abdominal pain, constipation, diarrhea, nausea and vomiting.  Musculoskeletal:  Positive for arthralgias (Left buttock pain and left hip  pain.) and back pain (Sacral back pain).  Skin:  Negative for color change and rash.  Neurological:  Negative for syncope.  All other systems reviewed and are negative.    Physical Exam Triage Vital Signs ED Triage Vitals  Encounter Vitals Group     BP 10/22/24 1114 106/70     Girls Systolic BP Percentile --      Girls Diastolic BP Percentile --      Boys Systolic BP Percentile --      Boys Diastolic BP Percentile --      Pulse Rate 10/22/24 1114 60     Resp 10/22/24 1114 20     Temp 10/22/24 1114 97.8 F (36.6 C)     Temp Source 10/22/24 1114 Oral     SpO2 10/22/24 1114 96 %     Weight --      Height --      Head Circumference --      Peak Flow --      Pain Score 10/22/24 1110 5     Pain Loc --      Pain Education --      Exclude from Growth Chart --    No data found.  Updated Vital Signs BP 106/70 (BP Location: Right Arm)   Pulse 60   Temp 97.8 F (36.6 C) (Oral)   Resp 20   SpO2 96%   Visual Acuity Right Eye Distance:   Left Eye Distance:   Bilateral Distance:    Right Eye Near:   Left Eye Near:    Bilateral Near:     Physical Exam Vitals and nursing note reviewed.  Constitutional:      General: She is not in acute distress.    Appearance: She is well-developed. She is not ill-appearing or toxic-appearing.  HENT:     Head: Normocephalic and atraumatic.     Right Ear: External ear normal.     Left Ear: External ear normal.     Nose: Nose normal.     Mouth/Throat:     Lips: Pink.     Mouth: Mucous membranes are moist.  Eyes:     Conjunctiva/sclera: Conjunctivae normal.     Pupils: Pupils are equal, round, and reactive to light.  Cardiovascular:     Rate and Rhythm: Normal rate and regular rhythm.     Heart sounds: S1 normal and S2 normal. No murmur heard. Pulmonary:     Effort: Pulmonary effort is normal. No respiratory distress.     Breath sounds: Normal breath sounds. No decreased breath sounds, wheezing, rhonchi or rales.  Musculoskeletal:         General: No swelling.     Cervical back: Normal.     Thoracic back: Normal.     Lumbar back:  Tenderness (L5-S2 and left buttock) and bony tenderness (L5-S2) present. No swelling, edema, deformity, signs of trauma, lacerations or spasms. Normal range of motion. No scoliosis.     Right hip: Bony tenderness (Along the left hip and left buttock) present. No deformity, lacerations, tenderness or crepitus. Normal range of motion. Normal strength.     Left hip: Normal.     Right upper leg: Normal.     Left upper leg: Normal.  Skin:    General: Skin is warm and dry.     Capillary Refill: Capillary refill takes less than 2 seconds.     Findings: No rash.  Neurological:     Mental Status: She is alert and oriented to person, place, and time.  Psychiatric:        Mood and Affect: Mood normal.      UC Treatments / Results  Labs (all labs ordered are listed, but only abnormal results are displayed)  Contains abnormal data Basic metabolic panel: 05/05/23:     Component Ref Range & Units (hover) 1 yr ago (05/05/23)   Sodium 138   Potassium 4.2   Chloride 107   CO2 24   Glucose, Bld 108 High    Comment: Glucose reference range applies only to samples taken after fasting for at least 8 hours.  BUN 13   Creatinine, Ser 0.88   Calcium 7.9 Low    GFR, Estimated >60   Comment: (NOTE) Calculated using the CKD-EPI Creatinine Equation (2021)  Anion gap 7   Comment: Performed at [facility], [address] [place], [place] [address]  Resulting Agency [facility]       EKG   Radiology No results found.  Procedures Procedures (including critical care time)  Medications Ordered in UC Medications - No data to display  Initial Impression / Assessment and Plan / UC Course  I have reviewed the triage vital signs and the nursing notes.  Pertinent labs & imaging results that were available during my care of the patient were reviewed by me and considered in my  medical decision making (see chart for details).  Plan of Care (see discharge instructions for additional patient precautions and education): History of previous pelvic fracture and bilateral hip replacements with left hip, left buttock and sacral pain after a fall on approximately 10/01/2024: X-rays are negative.  X-rays show previous hip replacements without any disruption of those surgical implants.  Will use meloxicam as previously prescribed for pain if needed.  Added tramadol, 50 mg, 1 pill every 6 hours if needed for pain.   If symptoms or pain persists, see primary care for possible referral to orthopedics and further workup.  I reviewed the plan of care with the patient and/or the patient's guardian.  The patient and/or guardian had time to ask questions and acknowledged that the questions were answered.  Final Clinical Impressions(s) / UC Diagnoses   Final diagnoses:  H/O bilateral hip replacements  History of pelvic fracture  Sacral back pain  Left buttock pain  Left hip pain  Fall on same level from slipping, tripping or stumbling, initial encounter     Discharge Instructions      History of previous pelvic fracture and bilateral hip replacements with left hip, left buttock and sacral pain after a fall on approximately 10/01/2024: X-rays are negative.  X-rays show previous hip replacements without any disruption of those surgical implants.  Will use meloxicam as previously prescribed for pain if needed.  Added tramadol,  50 mg, 1 pill every 6 hours if needed for pain.    If symptoms or pain persist, see primary care for possible referral to orthopedics and further workup.     ED Prescriptions     Medication Sig Dispense Auth. Provider   traMADol (ULTRAM) 50 MG tablet Take 1 tablet (50 mg total) by mouth every 6 (six) hours as needed. 15 tablet Akashdeep Chuba, FNP      I have reviewed the PDMP during this encounter.   Ival Domino, FNP 10/22/24 1249

## 2024-10-22 NOTE — ED Triage Notes (Addendum)
 Pt states 3 weeks ago she fell backwards after tripping over a bucket and fell directly on her tail bone onto concrete. Since then she has continued to have increased pain when sitting. Pain is mostly located in her left hip. Pt has hx of bilateral hip replacements- left hip replacement was 18 months ago and pelvic fracture. Pt denies swelling or bruising. She has taken tylenol  in the beginning with no relief so she has not taken any more pain meds. She is on meloxicam for arthritis but has not noticed any changes when she takes it.

## 2024-10-22 NOTE — Discharge Instructions (Addendum)
 History of previous pelvic fracture and bilateral hip replacements with left hip, left buttock and sacral pain after a fall on approximately 10/01/2024: X-rays are negative.  X-rays show previous hip replacements without any disruption of those surgical implants.  Will use meloxicam as previously prescribed for pain if needed.  Added tramadol, 50 mg, 1 pill every 6 hours if needed for pain.    If symptoms or pain persist, see primary care for possible referral to orthopedics and further workup.

## 2024-10-23 ENCOUNTER — Ambulatory Visit (HOSPITAL_BASED_OUTPATIENT_CLINIC_OR_DEPARTMENT_OTHER): Payer: Self-pay | Admitting: Family Medicine

## 2024-10-23 NOTE — Progress Notes (Signed)
 No fractures nor dislocations.  The patient does have degenerative disc disease and arthritis in her lower spine, pelvis and hips.  Patient was updated with these results during her visit.  No change in the plan of care.

## 2024-10-29 ENCOUNTER — Encounter: Payer: Self-pay | Admitting: Physician Assistant

## 2024-10-29 ENCOUNTER — Other Ambulatory Visit (INDEPENDENT_AMBULATORY_CARE_PROVIDER_SITE_OTHER)

## 2024-10-29 ENCOUNTER — Ambulatory Visit: Admitting: Physician Assistant

## 2024-10-29 DIAGNOSIS — M545 Low back pain, unspecified: Secondary | ICD-10-CM

## 2024-10-29 MED ORDER — GABAPENTIN 300 MG PO CAPS: 300.0000 mg | ORAL_CAPSULE | Freq: Every day | ORAL | 1 refills | Status: AC

## 2024-10-29 NOTE — Progress Notes (Signed)
 Office Visit Note   Patient: Julie Schneider           Date of Birth: February 25, 1956           MRN: [id number] Visit Date: 10/29/2024              Requested by: Loreli Kins, MD [address] [place],  [place] [address] PCP: Loreli Kins, MD   Assessment & Plan: Visit Diagnoses:  1. Acute left-sided low back pain without sciatica     Plan:  Will place her on Neurontin at night only.  She will call us  if she has any adverse effects to the Neurontin.  Did discuss sleepiness and suicidal ideation is possible risk.  Send her to formal therapy for her back to work on range of motion and strengthening decrease pain.  She will begin home exercise program that will include modalities prescription was written that she would like to go in [place].  She will follow-up with us  in 6 weeks sooner if there is any questions concerns.  She develops any radicular symptoms down the leg she will let us  know.  Follow-Up Instructions: Return in about 6 weeks (around 12/10/2024).   Orders:  Orders Placed This Encounter  Procedures   XR Lumbar Spine Complete   Meds ordered this encounter  Medications   gabapentin (NEURONTIN) 300 MG capsule    Sig: Take 1 capsule (300 mg total) by mouth at bedtime.    Dispense:  30 capsule    Refill:  1      Procedures: No procedures performed   Clinical Data: No additional findings.   Subjective: Chief Complaint  Patient presents with   Left Hip - Pain    Left total hip arthroplasty 05/04/2023   Lower Back - Pain    Coccyx pain    HPI 80: [name] 68 year old female with history of bilateral hip replacements by Dr. Vernetta.  Comes in today status post fall 1 month ago.  She was at her home tripped over a bucket falling on her tailbone.  She is seen in the ER where radiographs of her pelvis bilateral hips coccyx were all performed.  Personally reviewed these films which show no acute fractures.  No dislocations.  No bony abnormalities  no hardware failure or loosening. Continues to have pain mostly in the left sided lower back buttocks region.  Pain is worse whenever she is sitting that she feels pain and pressure in her lower back and buttocks region with every step.  Describes it as a burning base of the spine and into the buttocks.  No radicular symptoms down either leg.  She does also describe pressure in her lower abdomen/pelvis area as if she was pregnant which is new.  Pain is not awakening her but she does have difficulty going to sleep.  No saddle anesthesia.  No bowel or bladder changes at this point in time initially she did have some constipation she feels due to the fact that she was taking significant amount of Advil .  She was given tramadol  by the ER and only took 1 night.  She is using no assistive device to ambulate.  She has also been on turmeric for the past month.  Review of Systems  Gastrointestinal:  Positive for constipation.  Genitourinary:  Positive for pelvic pain. Negative for difficulty urinating.  Musculoskeletal:  Positive for back pain.  Neurological:  Negative for numbness.     Objective: Vital Signs: There were no  vitals taken for this visit.  Physical Exam Constitutional:      Appearance: She is not ill-appearing or diaphoretic.  Cardiovascular:     Pulses: Normal pulses.  Pulmonary:     Effort: Pulmonary effort is normal.  Neurological:     Mental Status: She is alert and oriented to person, place, and time.  Psychiatric:        Mood and Affect: Mood normal.     Ortho Exam Bilateral hips good range of motion of both hips without blocks.  She has slight discomfort with internal/external rotation of the left hip only.  Tenderness over the left hip trochanteric region only. Lumbar spine: She has full flexion and able to touch her toes.  She has limited extension and discomfort lower back with extension.  Tenderness lower lumbar sacral paraspinous region on the left only.  Straight leg  is positive on the left negative on the right. Lower extremities reveals weakness with the great toe on the left against resistance which is 4 out of 5 strength.  Otherwise 5 out of 5 strength throughout the lower extremities.  Sensation grossly intact bilateral feet to light touch.  Ambulates with a slight antalgic gait. Specialty Comments:  No specialty comments available.  Imaging: XR Lumbar Spine Complete Result Date: 10/29/2024 Lumbar spine: 3 views showed no acute fracture.  Grade 2 spondylolisthesis at L4-5 with significant generative disc disease at L4-5.  Lower lumbar facet changes.  Arthrosclerosis aorta.  No acute findings.  There is no subluxation at L4-5 with flexion extension.    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  Past Surgical History:  Procedure Laterality Date   FOOT SURGERY     PELVIC FRACTURE SURGERY     TOTAL HIP ARTHROPLASTY Left 05/04/2023   Procedure: LEFT TOTAL HIP ARTHROPLASTY ANTERIOR APPROACH;  Surgeon: Vernetta Lonni GRADE, MD;  Location: [facility];  Service: Orthopedics;  Laterality: Left;   TOTAL HIP ARTHROPLASTY Right    Social History   Occupational History   Not on file  Tobacco Use   Smoking status: Some Days    Types: Cigarettes   Smokeless tobacco: Never  Vaping  Use   Vaping status: Never Used  Substance and Sexual Activity   Alcohol use: Never   Drug use: Never   Sexual activity: Not on file

## 2024-12-10 ENCOUNTER — Ambulatory Visit: Admitting: Physician Assistant

## 2024-12-16 ENCOUNTER — Encounter: Payer: Self-pay | Admitting: Podiatry

## 2024-12-16 ENCOUNTER — Ambulatory Visit: Admitting: Podiatry

## 2024-12-16 DIAGNOSIS — B351 Tinea unguium: Secondary | ICD-10-CM

## 2024-12-16 DIAGNOSIS — M79609 Pain in unspecified limb: Secondary | ICD-10-CM

## 2024-12-16 NOTE — Progress Notes (Signed)
"  °  Subjective:  Patient ID: Barnie CINDERELLA Morley, female    DOB: 20-Jun-1956,  MRN: [id number]   69 y.o. female presents with the above complaint. History confirmed with patient. Patient presenting with pain related to dystrophic thickened elongated nails. Patient is unable to trim own nails related to nail dystrophy and/or mobility issues. Patient does not have a history of T2DM.  Does have some pincer nail deformity bilateral first toes that do bother her.  Objective:  Physical Exam: warm, good capillary refill nail exam onychomycosis of the toenails, onycholysis, and dystrophic nails DP pulses palpable, PT pulses palpable, and protective sensation intact Left Foot:  Pain with palpation of nails due to elongation and dystrophic growth. Tenderness left first toe lateral nail border Right Foot: Pain with palpation of nails due to elongation and dystrophic growth.   Assessment:   1. Pain due to onychomycosis of nail      Plan:  Patient was evaluated and treated and all questions answered.   #Onychomycosis with pain  -Nails palliatively debrided as below. -Educated on self-care  Procedure: Nail Debridement Rationale: Pain Type of Debridement: manual, sharp debridement. Instrumentation: Nail nipper, rotary burr. Number of Nails: 10  Can follow-up as needed for ingrown toenail procedure if nails continue to develop incurvation.  If she does begin to experience any symptoms of ingrown toenails she can soak feet in warm Epsom salt water  antibacterial ointment to affected nail border   Return in about 3 months (around 03/16/2025) for Routine Foot Care.         Ethan Saddler, DPM [facility] / [facility]                  "

## 2025-03-17 ENCOUNTER — Ambulatory Visit: Admitting: Podiatry
